# Patient Record
Sex: Male | Born: 1940 | Race: White | Hispanic: No | Smoking: Never smoker
Health system: Southern US, Community
[De-identification: ages and names within clinical notes are randomized; demographics above are authoritative.]

## PROBLEM LIST (undated history)

## (undated) DIAGNOSIS — I10 Essential (primary) hypertension: Secondary | ICD-10-CM

## (undated) DIAGNOSIS — I2510 Atherosclerotic heart disease of native coronary artery without angina pectoris: Secondary | ICD-10-CM

## (undated) DIAGNOSIS — I739 Peripheral vascular disease, unspecified: Secondary | ICD-10-CM

## (undated) DIAGNOSIS — G459 Transient cerebral ischemic attack, unspecified: Secondary | ICD-10-CM

## (undated) DIAGNOSIS — I219 Acute myocardial infarction, unspecified: Secondary | ICD-10-CM

## (undated) DIAGNOSIS — E119 Type 2 diabetes mellitus without complications: Secondary | ICD-10-CM

## (undated) DIAGNOSIS — I639 Cerebral infarction, unspecified: Secondary | ICD-10-CM

## (undated) HISTORY — PX: CAROTID ENDARTERECTOMY: SUR193

## (undated) HISTORY — PX: CORONARY ARTERY BYPASS GRAFT: SHX141

## (undated) HISTORY — PX: OTHER SURGICAL HISTORY: SHX169

---

## 2003-06-06 ENCOUNTER — Other Ambulatory Visit: Payer: Self-pay

## 2003-06-17 ENCOUNTER — Other Ambulatory Visit: Payer: Self-pay

## 2003-11-14 ENCOUNTER — Encounter: Payer: Self-pay | Admitting: Family Medicine

## 2003-12-10 ENCOUNTER — Encounter: Payer: Self-pay | Admitting: Family Medicine

## 2004-01-09 ENCOUNTER — Encounter: Payer: Self-pay | Admitting: Family Medicine

## 2004-02-09 ENCOUNTER — Encounter: Payer: Self-pay | Admitting: Family Medicine

## 2004-03-11 ENCOUNTER — Encounter: Payer: Self-pay | Admitting: Family Medicine

## 2004-04-08 ENCOUNTER — Encounter: Payer: Self-pay | Admitting: Family Medicine

## 2004-05-09 ENCOUNTER — Encounter: Payer: Self-pay | Admitting: Family Medicine

## 2004-06-08 ENCOUNTER — Encounter: Payer: Self-pay | Admitting: Family Medicine

## 2004-07-09 ENCOUNTER — Encounter: Payer: Self-pay | Admitting: Family Medicine

## 2004-08-08 ENCOUNTER — Encounter: Payer: Self-pay | Admitting: Family Medicine

## 2006-08-05 ENCOUNTER — Inpatient Hospital Stay: Payer: Self-pay | Admitting: Internal Medicine

## 2006-08-05 ENCOUNTER — Other Ambulatory Visit: Payer: Self-pay

## 2006-08-06 ENCOUNTER — Other Ambulatory Visit: Payer: Self-pay

## 2006-08-07 ENCOUNTER — Other Ambulatory Visit: Payer: Self-pay

## 2006-08-08 ENCOUNTER — Other Ambulatory Visit: Payer: Self-pay

## 2006-12-08 ENCOUNTER — Ambulatory Visit: Payer: Self-pay | Admitting: Family Medicine

## 2007-10-13 ENCOUNTER — Ambulatory Visit: Payer: Self-pay | Admitting: Internal Medicine

## 2008-03-27 ENCOUNTER — Ambulatory Visit: Payer: Self-pay | Admitting: Family Medicine

## 2008-04-02 ENCOUNTER — Ambulatory Visit: Payer: Self-pay | Admitting: Family Medicine

## 2008-04-15 ENCOUNTER — Ambulatory Visit: Payer: Self-pay | Admitting: Vascular Surgery

## 2008-06-05 ENCOUNTER — Ambulatory Visit: Payer: Self-pay | Admitting: Vascular Surgery

## 2008-06-14 ENCOUNTER — Inpatient Hospital Stay: Payer: Self-pay | Admitting: Vascular Surgery

## 2008-08-05 ENCOUNTER — Ambulatory Visit: Payer: Self-pay | Admitting: Vascular Surgery

## 2009-03-21 ENCOUNTER — Ambulatory Visit: Payer: Self-pay | Admitting: Family Medicine

## 2011-06-10 ENCOUNTER — Ambulatory Visit: Payer: Self-pay | Admitting: Family Medicine

## 2012-01-31 ENCOUNTER — Inpatient Hospital Stay: Payer: Self-pay | Admitting: Internal Medicine

## 2012-01-31 LAB — CBC WITH DIFFERENTIAL/PLATELET
Basophil #: 0.1 10*3/uL (ref 0.0–0.1)
Basophil %: 0.7 %
Eosinophil #: 0.1 10*3/uL (ref 0.0–0.7)
Eosinophil %: 0.9 %
HCT: 41 % (ref 40.0–52.0)
HGB: 14 g/dL (ref 13.0–18.0)
Lymphocyte #: 1.7 10*3/uL (ref 1.0–3.6)
Lymphocyte %: 19.4 %
MCH: 31.5 pg (ref 26.0–34.0)
Neutrophil #: 6.1 10*3/uL (ref 1.4–6.5)
Neutrophil %: 70 %
Platelet: 230 10*3/uL (ref 150–440)
RBC: 4.46 10*6/uL (ref 4.40–5.90)
RDW: 14.1 % (ref 11.5–14.5)

## 2012-01-31 LAB — COMPREHENSIVE METABOLIC PANEL
Albumin: 3.7 g/dL (ref 3.4–5.0)
BUN: 25 mg/dL — ABNORMAL HIGH (ref 7–18)
Bilirubin,Total: 0.6 mg/dL (ref 0.2–1.0)
Chloride: 109 mmol/L — ABNORMAL HIGH (ref 98–107)
Creatinine: 1.13 mg/dL (ref 0.60–1.30)
EGFR (African American): >60
SGOT(AST): 38 U/L — ABNORMAL HIGH (ref 15–37)
SGPT (ALT): 31 U/L (ref 12–78)
Sodium: 140 mmol/L (ref 136–145)
Total Protein: 7.2 g/dL (ref 6.4–8.2)

## 2012-01-31 LAB — CBC
HCT: 41.8 % (ref 40.0–52.0)
MCHC: 33.5 g/dL (ref 32.0–36.0)
MCV: 93 fL (ref 80–100)
Platelet: 231 10*3/uL (ref 150–440)
RBC: 4.52 10*6/uL (ref 4.40–5.90)
RDW: 14 % (ref 11.5–14.5)
WBC: 9.8 10*3/uL (ref 3.8–10.6)

## 2012-01-31 LAB — TROPONIN I
Troponin-I: 3.97 ng/mL — ABNORMAL HIGH
Troponin-I: 4.28 ng/mL — ABNORMAL HIGH

## 2012-01-31 LAB — APTT: Activated PTT: 26.4 secs (ref 23.6–35.9)

## 2012-02-01 LAB — CK TOTAL AND CKMB (NOT AT ARMC)
CK, Total: 157 U/L (ref 35–232)
CK-MB: 9.2 ng/mL — ABNORMAL HIGH (ref 0.5–3.6)

## 2012-02-02 LAB — BASIC METABOLIC PANEL
Anion Gap: 7 (ref 7–16)
BUN: 21 mg/dL — ABNORMAL HIGH (ref 7–18)
Chloride: 107 mmol/L (ref 98–107)
Creatinine: 1.23 mg/dL (ref 0.60–1.30)
EGFR (African American): >60
Glucose: 55 mg/dL — ABNORMAL LOW (ref 65–99)
Osmolality: 282 (ref 275–301)

## 2012-02-02 LAB — CBC WITH DIFFERENTIAL/PLATELET
Basophil #: 0 10*3/uL (ref 0.0–0.1)
Eosinophil #: 0.2 10*3/uL (ref 0.0–0.7)
Eosinophil %: 1.8 %
Lymphocyte #: 1.9 10*3/uL (ref 1.0–3.6)
MCV: 93 fL (ref 80–100)
Monocyte %: 9.9 %
Neutrophil %: 66.2 %
Platelet: 216 10*3/uL (ref 150–440)
RDW: 13.9 % (ref 11.5–14.5)
WBC: 8.9 10*3/uL (ref 3.8–10.6)

## 2012-08-02 ENCOUNTER — Ambulatory Visit: Payer: Self-pay | Admitting: Family Medicine

## 2012-08-16 ENCOUNTER — Ambulatory Visit: Payer: Self-pay | Admitting: Vascular Surgery

## 2012-08-18 ENCOUNTER — Ambulatory Visit: Payer: Self-pay | Admitting: Family Medicine

## 2012-08-18 LAB — CBC WITH DIFFERENTIAL/PLATELET
Basophil #: 0 10*3/uL (ref 0.0–0.1)
Eosinophil %: 0.3 %
HCT: 45.9 % (ref 40.0–52.0)
HGB: 15.9 g/dL (ref 13.0–18.0)
MCH: 31.5 pg (ref 26.0–34.0)
MCHC: 34.6 g/dL (ref 32.0–36.0)
Monocyte #: 0.4 x10 3/mm (ref 0.2–1.0)
Monocyte %: 4.3 %
Neutrophil #: 7.4 10*3/uL — ABNORMAL HIGH (ref 1.4–6.5)
Neutrophil %: 79.1 %
Platelet: 211 10*3/uL (ref 150–440)
RDW: 13.9 % (ref 11.5–14.5)
WBC: 9.4 10*3/uL (ref 3.8–10.6)

## 2012-09-11 ENCOUNTER — Ambulatory Visit: Payer: Self-pay | Admitting: Neurology

## 2012-12-26 ENCOUNTER — Ambulatory Visit: Payer: Self-pay | Admitting: Vascular Surgery

## 2012-12-26 DIAGNOSIS — I2510 Atherosclerotic heart disease of native coronary artery without angina pectoris: Secondary | ICD-10-CM

## 2012-12-26 LAB — BASIC METABOLIC PANEL
Anion Gap: 3 — ABNORMAL LOW (ref 7–16)
BUN: 22 mg/dL — ABNORMAL HIGH (ref 7–18)
Calcium, Total: 9.3 mg/dL (ref 8.5–10.1)
Co2: 28 mmol/L (ref 21–32)
Creatinine: 1.19 mg/dL (ref 0.60–1.30)
EGFR (African American): >60
EGFR (Non-African Amer.): 60 — ABNORMAL LOW
Glucose: 176 mg/dL — ABNORMAL HIGH (ref 65–99)

## 2012-12-26 LAB — CBC
HGB: 14.1 g/dL (ref 13.0–18.0)
MCH: 31.8 pg (ref 26.0–34.0)
RBC: 4.43 10*6/uL (ref 4.40–5.90)

## 2013-01-03 ENCOUNTER — Inpatient Hospital Stay: Payer: Self-pay | Admitting: Vascular Surgery

## 2013-01-03 LAB — BASIC METABOLIC PANEL
Anion Gap: 10 (ref 7–16)
BUN: 23 mg/dL — ABNORMAL HIGH (ref 7–18)
Co2: 19 mmol/L — ABNORMAL LOW (ref 21–32)
EGFR (African American): >60
Osmolality: 288 (ref 275–301)
Potassium: 4.1 mmol/L (ref 3.5–5.1)
Sodium: 142 mmol/L (ref 136–145)

## 2013-01-03 LAB — CBC WITH DIFFERENTIAL/PLATELET
Basophil #: 0.1 10*3/uL (ref 0.0–0.1)
Basophil %: 0.5 %
Eosinophil %: 0.3 %
Lymphocyte #: 1 10*3/uL (ref 1.0–3.6)
Lymphocyte %: 10 %
MCH: 31.8 pg (ref 26.0–34.0)
MCHC: 34.1 g/dL (ref 32.0–36.0)
MCV: 93 fL (ref 80–100)
Monocyte %: 6 %
Neutrophil %: 83.2 %
Platelet: 193 10*3/uL (ref 150–440)
RBC: 3.79 10*6/uL — ABNORMAL LOW (ref 4.40–5.90)
WBC: 9.9 10*3/uL (ref 3.8–10.6)

## 2013-01-03 LAB — TROPONIN I: Troponin-I: 0.02 ng/mL

## 2013-01-03 LAB — MAGNESIUM: Magnesium: 1.9 mg/dL

## 2013-01-04 LAB — CBC WITH DIFFERENTIAL/PLATELET
Eosinophil #: 0 10*3/uL (ref 0.0–0.7)
HCT: 34 % — ABNORMAL LOW (ref 40.0–52.0)
HGB: 11.6 g/dL — ABNORMAL LOW (ref 13.0–18.0)
Lymphocyte #: 1.2 10*3/uL (ref 1.0–3.6)
Lymphocyte %: 12.9 %
MCH: 32 pg (ref 26.0–34.0)
MCHC: 34.2 g/dL (ref 32.0–36.0)
Monocyte #: 1 x10 3/mm (ref 0.2–1.0)
Monocyte %: 10.9 %
Neutrophil #: 7 10*3/uL — ABNORMAL HIGH (ref 1.4–6.5)
Neutrophil %: 75.2 %
RBC: 3.63 10*6/uL — ABNORMAL LOW (ref 4.40–5.90)
RDW: 13.9 % (ref 11.5–14.5)

## 2013-01-04 LAB — BASIC METABOLIC PANEL
BUN: 22 mg/dL — ABNORMAL HIGH (ref 7–18)
Chloride: 113 mmol/L — ABNORMAL HIGH (ref 98–107)
Co2: 23 mmol/L (ref 21–32)
EGFR (African American): >60
EGFR (Non-African Amer.): 56 — ABNORMAL LOW
Potassium: 3.8 mmol/L (ref 3.5–5.1)
Sodium: 142 mmol/L (ref 136–145)

## 2013-01-04 LAB — APTT: Activated PTT: 28.7 secs (ref 23.6–35.9)

## 2013-01-05 LAB — BASIC METABOLIC PANEL
BUN: 20 mg/dL — ABNORMAL HIGH (ref 7–18)
Chloride: 109 mmol/L — ABNORMAL HIGH (ref 98–107)
Creatinine: 1.33 mg/dL — ABNORMAL HIGH (ref 0.60–1.30)
Osmolality: 286 (ref 275–301)

## 2013-01-05 LAB — PATHOLOGY REPORT

## 2013-03-19 ENCOUNTER — Ambulatory Visit: Payer: Self-pay | Admitting: Family Medicine

## 2013-05-07 ENCOUNTER — Ambulatory Visit: Payer: Self-pay | Admitting: Ophthalmology

## 2013-06-12 ENCOUNTER — Ambulatory Visit: Payer: Self-pay | Admitting: Urology

## 2013-07-20 ENCOUNTER — Ambulatory Visit: Payer: Self-pay | Admitting: Family Medicine

## 2014-03-04 DIAGNOSIS — H34812 Central retinal vein occlusion, left eye: Secondary | ICD-10-CM | POA: Diagnosis not present

## 2014-03-12 DIAGNOSIS — R221 Localized swelling, mass and lump, neck: Secondary | ICD-10-CM | POA: Diagnosis not present

## 2014-03-13 DIAGNOSIS — H4011X4 Primary open-angle glaucoma, indeterminate stage: Secondary | ICD-10-CM | POA: Diagnosis not present

## 2014-03-14 ENCOUNTER — Ambulatory Visit: Payer: Self-pay | Admitting: Otolaryngology

## 2014-03-14 DIAGNOSIS — R590 Localized enlarged lymph nodes: Secondary | ICD-10-CM | POA: Diagnosis not present

## 2014-03-14 DIAGNOSIS — M47892 Other spondylosis, cervical region: Secondary | ICD-10-CM | POA: Diagnosis not present

## 2014-03-14 DIAGNOSIS — R221 Localized swelling, mass and lump, neck: Secondary | ICD-10-CM | POA: Diagnosis not present

## 2014-03-14 DIAGNOSIS — I2510 Atherosclerotic heart disease of native coronary artery without angina pectoris: Secondary | ICD-10-CM | POA: Diagnosis not present

## 2014-03-14 DIAGNOSIS — I881 Chronic lymphadenitis, except mesenteric: Secondary | ICD-10-CM | POA: Diagnosis not present

## 2014-03-14 LAB — CREATININE, SERUM
CREATININE: 1.29 mg/dL (ref 0.60–1.30)
GFR CALC NON AF AMER: 58 — AB

## 2014-03-19 DIAGNOSIS — R221 Localized swelling, mass and lump, neck: Secondary | ICD-10-CM | POA: Diagnosis not present

## 2014-03-19 DIAGNOSIS — C01 Malignant neoplasm of base of tongue: Secondary | ICD-10-CM | POA: Diagnosis not present

## 2014-03-20 ENCOUNTER — Ambulatory Visit: Payer: Self-pay | Admitting: Oncology

## 2014-03-22 ENCOUNTER — Ambulatory Visit: Payer: Self-pay | Admitting: Oncology

## 2014-03-22 DIAGNOSIS — Z8673 Personal history of transient ischemic attack (TIA), and cerebral infarction without residual deficits: Secondary | ICD-10-CM | POA: Diagnosis not present

## 2014-03-22 DIAGNOSIS — Z794 Long term (current) use of insulin: Secondary | ICD-10-CM | POA: Diagnosis not present

## 2014-03-22 DIAGNOSIS — E119 Type 2 diabetes mellitus without complications: Secondary | ICD-10-CM | POA: Diagnosis not present

## 2014-03-22 DIAGNOSIS — I2510 Atherosclerotic heart disease of native coronary artery without angina pectoris: Secondary | ICD-10-CM | POA: Diagnosis not present

## 2014-03-22 DIAGNOSIS — I10 Essential (primary) hypertension: Secondary | ICD-10-CM | POA: Diagnosis not present

## 2014-03-22 DIAGNOSIS — Z951 Presence of aortocoronary bypass graft: Secondary | ICD-10-CM | POA: Diagnosis not present

## 2014-03-22 DIAGNOSIS — N289 Disorder of kidney and ureter, unspecified: Secondary | ICD-10-CM | POA: Diagnosis not present

## 2014-03-22 DIAGNOSIS — C01 Malignant neoplasm of base of tongue: Secondary | ICD-10-CM | POA: Diagnosis not present

## 2014-03-22 DIAGNOSIS — Z79899 Other long term (current) drug therapy: Secondary | ICD-10-CM | POA: Diagnosis not present

## 2014-03-22 DIAGNOSIS — Z510 Encounter for antineoplastic radiation therapy: Secondary | ICD-10-CM | POA: Diagnosis not present

## 2014-03-22 DIAGNOSIS — I252 Old myocardial infarction: Secondary | ICD-10-CM | POA: Diagnosis not present

## 2014-03-22 DIAGNOSIS — Z5111 Encounter for antineoplastic chemotherapy: Secondary | ICD-10-CM | POA: Diagnosis not present

## 2014-03-22 DIAGNOSIS — C770 Secondary and unspecified malignant neoplasm of lymph nodes of head, face and neck: Secondary | ICD-10-CM | POA: Diagnosis not present

## 2014-03-22 DIAGNOSIS — Z7982 Long term (current) use of aspirin: Secondary | ICD-10-CM | POA: Diagnosis not present

## 2014-03-26 ENCOUNTER — Ambulatory Visit: Payer: Self-pay | Admitting: Oncology

## 2014-03-26 DIAGNOSIS — C490 Malignant neoplasm of connective and soft tissue of head, face and neck: Secondary | ICD-10-CM | POA: Diagnosis not present

## 2014-03-26 DIAGNOSIS — C029 Malignant neoplasm of tongue, unspecified: Secondary | ICD-10-CM | POA: Diagnosis not present

## 2014-03-26 DIAGNOSIS — C01 Malignant neoplasm of base of tongue: Secondary | ICD-10-CM | POA: Diagnosis not present

## 2014-04-01 DIAGNOSIS — I2510 Atherosclerotic heart disease of native coronary artery without angina pectoris: Secondary | ICD-10-CM | POA: Diagnosis not present

## 2014-04-01 DIAGNOSIS — Z8673 Personal history of transient ischemic attack (TIA), and cerebral infarction without residual deficits: Secondary | ICD-10-CM | POA: Diagnosis not present

## 2014-04-01 DIAGNOSIS — I10 Essential (primary) hypertension: Secondary | ICD-10-CM | POA: Diagnosis not present

## 2014-04-01 DIAGNOSIS — Z5111 Encounter for antineoplastic chemotherapy: Secondary | ICD-10-CM | POA: Diagnosis not present

## 2014-04-01 DIAGNOSIS — N289 Disorder of kidney and ureter, unspecified: Secondary | ICD-10-CM | POA: Diagnosis not present

## 2014-04-01 DIAGNOSIS — E119 Type 2 diabetes mellitus without complications: Secondary | ICD-10-CM | POA: Diagnosis not present

## 2014-04-01 DIAGNOSIS — I252 Old myocardial infarction: Secondary | ICD-10-CM | POA: Diagnosis not present

## 2014-04-01 DIAGNOSIS — Z510 Encounter for antineoplastic radiation therapy: Secondary | ICD-10-CM | POA: Diagnosis not present

## 2014-04-01 DIAGNOSIS — C01 Malignant neoplasm of base of tongue: Secondary | ICD-10-CM | POA: Diagnosis not present

## 2014-04-01 DIAGNOSIS — C770 Secondary and unspecified malignant neoplasm of lymph nodes of head, face and neck: Secondary | ICD-10-CM | POA: Diagnosis not present

## 2014-04-01 DIAGNOSIS — Z7982 Long term (current) use of aspirin: Secondary | ICD-10-CM | POA: Diagnosis not present

## 2014-04-01 DIAGNOSIS — Z951 Presence of aortocoronary bypass graft: Secondary | ICD-10-CM | POA: Diagnosis not present

## 2014-04-01 DIAGNOSIS — Z79899 Other long term (current) drug therapy: Secondary | ICD-10-CM | POA: Diagnosis not present

## 2014-04-01 DIAGNOSIS — Z794 Long term (current) use of insulin: Secondary | ICD-10-CM | POA: Diagnosis not present

## 2014-04-03 DIAGNOSIS — I10 Essential (primary) hypertension: Secondary | ICD-10-CM | POA: Diagnosis not present

## 2014-04-03 DIAGNOSIS — N289 Disorder of kidney and ureter, unspecified: Secondary | ICD-10-CM | POA: Diagnosis not present

## 2014-04-03 DIAGNOSIS — Z7982 Long term (current) use of aspirin: Secondary | ICD-10-CM | POA: Diagnosis not present

## 2014-04-03 DIAGNOSIS — Z794 Long term (current) use of insulin: Secondary | ICD-10-CM | POA: Diagnosis not present

## 2014-04-03 DIAGNOSIS — I2510 Atherosclerotic heart disease of native coronary artery without angina pectoris: Secondary | ICD-10-CM | POA: Diagnosis not present

## 2014-04-03 DIAGNOSIS — C01 Malignant neoplasm of base of tongue: Secondary | ICD-10-CM | POA: Diagnosis not present

## 2014-04-03 DIAGNOSIS — Z510 Encounter for antineoplastic radiation therapy: Secondary | ICD-10-CM | POA: Diagnosis not present

## 2014-04-03 DIAGNOSIS — Z5111 Encounter for antineoplastic chemotherapy: Secondary | ICD-10-CM | POA: Diagnosis not present

## 2014-04-03 DIAGNOSIS — I252 Old myocardial infarction: Secondary | ICD-10-CM | POA: Diagnosis not present

## 2014-04-03 DIAGNOSIS — C770 Secondary and unspecified malignant neoplasm of lymph nodes of head, face and neck: Secondary | ICD-10-CM | POA: Diagnosis not present

## 2014-04-03 DIAGNOSIS — Z951 Presence of aortocoronary bypass graft: Secondary | ICD-10-CM | POA: Diagnosis not present

## 2014-04-03 DIAGNOSIS — E119 Type 2 diabetes mellitus without complications: Secondary | ICD-10-CM | POA: Diagnosis not present

## 2014-04-03 DIAGNOSIS — Z79899 Other long term (current) drug therapy: Secondary | ICD-10-CM | POA: Diagnosis not present

## 2014-04-03 DIAGNOSIS — Z8673 Personal history of transient ischemic attack (TIA), and cerebral infarction without residual deficits: Secondary | ICD-10-CM | POA: Diagnosis not present

## 2014-04-08 DIAGNOSIS — Z510 Encounter for antineoplastic radiation therapy: Secondary | ICD-10-CM | POA: Diagnosis not present

## 2014-04-08 DIAGNOSIS — Z5111 Encounter for antineoplastic chemotherapy: Secondary | ICD-10-CM | POA: Diagnosis not present

## 2014-04-08 DIAGNOSIS — N289 Disorder of kidney and ureter, unspecified: Secondary | ICD-10-CM | POA: Diagnosis not present

## 2014-04-08 DIAGNOSIS — I252 Old myocardial infarction: Secondary | ICD-10-CM | POA: Diagnosis not present

## 2014-04-08 DIAGNOSIS — C01 Malignant neoplasm of base of tongue: Secondary | ICD-10-CM | POA: Diagnosis not present

## 2014-04-08 DIAGNOSIS — Z7982 Long term (current) use of aspirin: Secondary | ICD-10-CM | POA: Diagnosis not present

## 2014-04-08 DIAGNOSIS — Z79899 Other long term (current) drug therapy: Secondary | ICD-10-CM | POA: Diagnosis not present

## 2014-04-08 DIAGNOSIS — E119 Type 2 diabetes mellitus without complications: Secondary | ICD-10-CM | POA: Diagnosis not present

## 2014-04-08 DIAGNOSIS — Z951 Presence of aortocoronary bypass graft: Secondary | ICD-10-CM | POA: Diagnosis not present

## 2014-04-08 DIAGNOSIS — Z8673 Personal history of transient ischemic attack (TIA), and cerebral infarction without residual deficits: Secondary | ICD-10-CM | POA: Diagnosis not present

## 2014-04-08 DIAGNOSIS — I2510 Atherosclerotic heart disease of native coronary artery without angina pectoris: Secondary | ICD-10-CM | POA: Diagnosis not present

## 2014-04-08 DIAGNOSIS — C770 Secondary and unspecified malignant neoplasm of lymph nodes of head, face and neck: Secondary | ICD-10-CM | POA: Diagnosis not present

## 2014-04-08 DIAGNOSIS — I10 Essential (primary) hypertension: Secondary | ICD-10-CM | POA: Diagnosis not present

## 2014-04-08 DIAGNOSIS — Z794 Long term (current) use of insulin: Secondary | ICD-10-CM | POA: Diagnosis not present

## 2014-04-09 ENCOUNTER — Ambulatory Visit
Admit: 2014-04-09 | Disposition: A | Discharge status: Home / Self Care | Payer: Self-pay | Attending: Oncology | Admitting: Oncology

## 2014-04-15 DIAGNOSIS — C770 Secondary and unspecified malignant neoplasm of lymph nodes of head, face and neck: Secondary | ICD-10-CM | POA: Diagnosis not present

## 2014-04-15 DIAGNOSIS — N289 Disorder of kidney and ureter, unspecified: Secondary | ICD-10-CM | POA: Diagnosis not present

## 2014-04-15 DIAGNOSIS — E1165 Type 2 diabetes mellitus with hyperglycemia: Secondary | ICD-10-CM | POA: Diagnosis not present

## 2014-04-15 DIAGNOSIS — I10 Essential (primary) hypertension: Secondary | ICD-10-CM | POA: Diagnosis not present

## 2014-04-15 DIAGNOSIS — R5383 Other fatigue: Secondary | ICD-10-CM | POA: Diagnosis not present

## 2014-04-15 DIAGNOSIS — R630 Anorexia: Secondary | ICD-10-CM | POA: Diagnosis not present

## 2014-04-15 DIAGNOSIS — K5900 Constipation, unspecified: Secondary | ICD-10-CM | POA: Diagnosis not present

## 2014-04-15 DIAGNOSIS — Z510 Encounter for antineoplastic radiation therapy: Secondary | ICD-10-CM | POA: Diagnosis not present

## 2014-04-15 DIAGNOSIS — Z8673 Personal history of transient ischemic attack (TIA), and cerebral infarction without residual deficits: Secondary | ICD-10-CM | POA: Diagnosis not present

## 2014-04-15 DIAGNOSIS — Z7982 Long term (current) use of aspirin: Secondary | ICD-10-CM | POA: Diagnosis not present

## 2014-04-15 DIAGNOSIS — Z79899 Other long term (current) drug therapy: Secondary | ICD-10-CM | POA: Diagnosis not present

## 2014-04-15 DIAGNOSIS — Z951 Presence of aortocoronary bypass graft: Secondary | ICD-10-CM | POA: Diagnosis not present

## 2014-04-15 DIAGNOSIS — Z5111 Encounter for antineoplastic chemotherapy: Secondary | ICD-10-CM | POA: Diagnosis not present

## 2014-04-15 DIAGNOSIS — I252 Old myocardial infarction: Secondary | ICD-10-CM | POA: Diagnosis not present

## 2014-04-15 DIAGNOSIS — C01 Malignant neoplasm of base of tongue: Secondary | ICD-10-CM | POA: Diagnosis not present

## 2014-04-15 DIAGNOSIS — E119 Type 2 diabetes mellitus without complications: Secondary | ICD-10-CM | POA: Diagnosis not present

## 2014-04-15 DIAGNOSIS — Z794 Long term (current) use of insulin: Secondary | ICD-10-CM | POA: Diagnosis not present

## 2014-04-15 DIAGNOSIS — R1310 Dysphagia, unspecified: Secondary | ICD-10-CM | POA: Diagnosis not present

## 2014-04-15 DIAGNOSIS — I2510 Atherosclerotic heart disease of native coronary artery without angina pectoris: Secondary | ICD-10-CM | POA: Diagnosis not present

## 2014-04-16 DIAGNOSIS — Z951 Presence of aortocoronary bypass graft: Secondary | ICD-10-CM | POA: Diagnosis not present

## 2014-04-16 DIAGNOSIS — C770 Secondary and unspecified malignant neoplasm of lymph nodes of head, face and neck: Secondary | ICD-10-CM | POA: Diagnosis not present

## 2014-04-16 DIAGNOSIS — Z5111 Encounter for antineoplastic chemotherapy: Secondary | ICD-10-CM | POA: Diagnosis not present

## 2014-04-16 DIAGNOSIS — K5900 Constipation, unspecified: Secondary | ICD-10-CM | POA: Diagnosis not present

## 2014-04-16 DIAGNOSIS — Z7982 Long term (current) use of aspirin: Secondary | ICD-10-CM | POA: Diagnosis not present

## 2014-04-16 DIAGNOSIS — I2510 Atherosclerotic heart disease of native coronary artery without angina pectoris: Secondary | ICD-10-CM | POA: Diagnosis not present

## 2014-04-16 DIAGNOSIS — Z510 Encounter for antineoplastic radiation therapy: Secondary | ICD-10-CM | POA: Diagnosis not present

## 2014-04-16 DIAGNOSIS — N289 Disorder of kidney and ureter, unspecified: Secondary | ICD-10-CM | POA: Diagnosis not present

## 2014-04-16 DIAGNOSIS — I252 Old myocardial infarction: Secondary | ICD-10-CM | POA: Diagnosis not present

## 2014-04-16 DIAGNOSIS — E119 Type 2 diabetes mellitus without complications: Secondary | ICD-10-CM | POA: Diagnosis not present

## 2014-04-16 DIAGNOSIS — C01 Malignant neoplasm of base of tongue: Secondary | ICD-10-CM | POA: Diagnosis not present

## 2014-04-16 DIAGNOSIS — E1165 Type 2 diabetes mellitus with hyperglycemia: Secondary | ICD-10-CM | POA: Diagnosis not present

## 2014-04-16 DIAGNOSIS — I10 Essential (primary) hypertension: Secondary | ICD-10-CM | POA: Diagnosis not present

## 2014-04-16 DIAGNOSIS — Z79899 Other long term (current) drug therapy: Secondary | ICD-10-CM | POA: Diagnosis not present

## 2014-04-16 DIAGNOSIS — R630 Anorexia: Secondary | ICD-10-CM | POA: Diagnosis not present

## 2014-04-16 DIAGNOSIS — Z794 Long term (current) use of insulin: Secondary | ICD-10-CM | POA: Diagnosis not present

## 2014-04-16 DIAGNOSIS — R1310 Dysphagia, unspecified: Secondary | ICD-10-CM | POA: Diagnosis not present

## 2014-04-16 DIAGNOSIS — R5383 Other fatigue: Secondary | ICD-10-CM | POA: Diagnosis not present

## 2014-04-16 DIAGNOSIS — Z8673 Personal history of transient ischemic attack (TIA), and cerebral infarction without residual deficits: Secondary | ICD-10-CM | POA: Diagnosis not present

## 2014-04-17 DIAGNOSIS — Z79899 Other long term (current) drug therapy: Secondary | ICD-10-CM | POA: Diagnosis not present

## 2014-04-17 DIAGNOSIS — C01 Malignant neoplasm of base of tongue: Secondary | ICD-10-CM | POA: Diagnosis not present

## 2014-04-17 DIAGNOSIS — C770 Secondary and unspecified malignant neoplasm of lymph nodes of head, face and neck: Secondary | ICD-10-CM | POA: Diagnosis not present

## 2014-04-17 DIAGNOSIS — R1310 Dysphagia, unspecified: Secondary | ICD-10-CM | POA: Diagnosis not present

## 2014-04-17 DIAGNOSIS — Z7982 Long term (current) use of aspirin: Secondary | ICD-10-CM | POA: Diagnosis not present

## 2014-04-17 DIAGNOSIS — E1165 Type 2 diabetes mellitus with hyperglycemia: Secondary | ICD-10-CM | POA: Diagnosis not present

## 2014-04-17 DIAGNOSIS — R5383 Other fatigue: Secondary | ICD-10-CM | POA: Diagnosis not present

## 2014-04-17 DIAGNOSIS — Z5111 Encounter for antineoplastic chemotherapy: Secondary | ICD-10-CM | POA: Diagnosis not present

## 2014-04-17 DIAGNOSIS — N289 Disorder of kidney and ureter, unspecified: Secondary | ICD-10-CM | POA: Diagnosis not present

## 2014-04-17 DIAGNOSIS — E119 Type 2 diabetes mellitus without complications: Secondary | ICD-10-CM | POA: Diagnosis not present

## 2014-04-17 DIAGNOSIS — I10 Essential (primary) hypertension: Secondary | ICD-10-CM | POA: Diagnosis not present

## 2014-04-17 DIAGNOSIS — Z951 Presence of aortocoronary bypass graft: Secondary | ICD-10-CM | POA: Diagnosis not present

## 2014-04-17 DIAGNOSIS — Z794 Long term (current) use of insulin: Secondary | ICD-10-CM | POA: Diagnosis not present

## 2014-04-17 DIAGNOSIS — K5900 Constipation, unspecified: Secondary | ICD-10-CM | POA: Diagnosis not present

## 2014-04-17 DIAGNOSIS — R630 Anorexia: Secondary | ICD-10-CM | POA: Diagnosis not present

## 2014-04-17 DIAGNOSIS — Z510 Encounter for antineoplastic radiation therapy: Secondary | ICD-10-CM | POA: Diagnosis not present

## 2014-04-17 DIAGNOSIS — Z8673 Personal history of transient ischemic attack (TIA), and cerebral infarction without residual deficits: Secondary | ICD-10-CM | POA: Diagnosis not present

## 2014-04-17 DIAGNOSIS — I2510 Atherosclerotic heart disease of native coronary artery without angina pectoris: Secondary | ICD-10-CM | POA: Diagnosis not present

## 2014-04-17 DIAGNOSIS — I252 Old myocardial infarction: Secondary | ICD-10-CM | POA: Diagnosis not present

## 2014-04-18 DIAGNOSIS — R1310 Dysphagia, unspecified: Secondary | ICD-10-CM | POA: Diagnosis not present

## 2014-04-18 DIAGNOSIS — I10 Essential (primary) hypertension: Secondary | ICD-10-CM | POA: Diagnosis not present

## 2014-04-18 DIAGNOSIS — Z8673 Personal history of transient ischemic attack (TIA), and cerebral infarction without residual deficits: Secondary | ICD-10-CM | POA: Diagnosis not present

## 2014-04-18 DIAGNOSIS — C770 Secondary and unspecified malignant neoplasm of lymph nodes of head, face and neck: Secondary | ICD-10-CM | POA: Diagnosis not present

## 2014-04-18 DIAGNOSIS — Z951 Presence of aortocoronary bypass graft: Secondary | ICD-10-CM | POA: Diagnosis not present

## 2014-04-18 DIAGNOSIS — Z79899 Other long term (current) drug therapy: Secondary | ICD-10-CM | POA: Diagnosis not present

## 2014-04-18 DIAGNOSIS — Z7982 Long term (current) use of aspirin: Secondary | ICD-10-CM | POA: Diagnosis not present

## 2014-04-18 DIAGNOSIS — K5900 Constipation, unspecified: Secondary | ICD-10-CM | POA: Diagnosis not present

## 2014-04-18 DIAGNOSIS — E119 Type 2 diabetes mellitus without complications: Secondary | ICD-10-CM | POA: Diagnosis not present

## 2014-04-18 DIAGNOSIS — I2510 Atherosclerotic heart disease of native coronary artery without angina pectoris: Secondary | ICD-10-CM | POA: Diagnosis not present

## 2014-04-18 DIAGNOSIS — E1165 Type 2 diabetes mellitus with hyperglycemia: Secondary | ICD-10-CM | POA: Diagnosis not present

## 2014-04-18 DIAGNOSIS — N289 Disorder of kidney and ureter, unspecified: Secondary | ICD-10-CM | POA: Diagnosis not present

## 2014-04-18 DIAGNOSIS — R630 Anorexia: Secondary | ICD-10-CM | POA: Diagnosis not present

## 2014-04-18 DIAGNOSIS — Z794 Long term (current) use of insulin: Secondary | ICD-10-CM | POA: Diagnosis not present

## 2014-04-18 DIAGNOSIS — Z5111 Encounter for antineoplastic chemotherapy: Secondary | ICD-10-CM | POA: Diagnosis not present

## 2014-04-18 DIAGNOSIS — R5383 Other fatigue: Secondary | ICD-10-CM | POA: Diagnosis not present

## 2014-04-18 DIAGNOSIS — C01 Malignant neoplasm of base of tongue: Secondary | ICD-10-CM | POA: Diagnosis not present

## 2014-04-18 DIAGNOSIS — I252 Old myocardial infarction: Secondary | ICD-10-CM | POA: Diagnosis not present

## 2014-04-18 DIAGNOSIS — Z510 Encounter for antineoplastic radiation therapy: Secondary | ICD-10-CM | POA: Diagnosis not present

## 2014-04-19 DIAGNOSIS — R630 Anorexia: Secondary | ICD-10-CM | POA: Diagnosis not present

## 2014-04-19 DIAGNOSIS — R5383 Other fatigue: Secondary | ICD-10-CM | POA: Diagnosis not present

## 2014-04-19 DIAGNOSIS — I2510 Atherosclerotic heart disease of native coronary artery without angina pectoris: Secondary | ICD-10-CM | POA: Diagnosis not present

## 2014-04-19 DIAGNOSIS — C01 Malignant neoplasm of base of tongue: Secondary | ICD-10-CM | POA: Diagnosis not present

## 2014-04-19 DIAGNOSIS — Z510 Encounter for antineoplastic radiation therapy: Secondary | ICD-10-CM | POA: Diagnosis not present

## 2014-04-19 DIAGNOSIS — Z5111 Encounter for antineoplastic chemotherapy: Secondary | ICD-10-CM | POA: Diagnosis not present

## 2014-04-19 DIAGNOSIS — Z79899 Other long term (current) drug therapy: Secondary | ICD-10-CM | POA: Diagnosis not present

## 2014-04-19 DIAGNOSIS — N289 Disorder of kidney and ureter, unspecified: Secondary | ICD-10-CM | POA: Diagnosis not present

## 2014-04-19 DIAGNOSIS — E1165 Type 2 diabetes mellitus with hyperglycemia: Secondary | ICD-10-CM | POA: Diagnosis not present

## 2014-04-19 DIAGNOSIS — I252 Old myocardial infarction: Secondary | ICD-10-CM | POA: Diagnosis not present

## 2014-04-19 DIAGNOSIS — Z8673 Personal history of transient ischemic attack (TIA), and cerebral infarction without residual deficits: Secondary | ICD-10-CM | POA: Diagnosis not present

## 2014-04-19 DIAGNOSIS — I10 Essential (primary) hypertension: Secondary | ICD-10-CM | POA: Diagnosis not present

## 2014-04-19 DIAGNOSIS — Z794 Long term (current) use of insulin: Secondary | ICD-10-CM | POA: Diagnosis not present

## 2014-04-19 DIAGNOSIS — R1310 Dysphagia, unspecified: Secondary | ICD-10-CM | POA: Diagnosis not present

## 2014-04-19 DIAGNOSIS — Z951 Presence of aortocoronary bypass graft: Secondary | ICD-10-CM | POA: Diagnosis not present

## 2014-04-19 DIAGNOSIS — K5900 Constipation, unspecified: Secondary | ICD-10-CM | POA: Diagnosis not present

## 2014-04-19 DIAGNOSIS — C770 Secondary and unspecified malignant neoplasm of lymph nodes of head, face and neck: Secondary | ICD-10-CM | POA: Diagnosis not present

## 2014-04-19 DIAGNOSIS — Z7982 Long term (current) use of aspirin: Secondary | ICD-10-CM | POA: Diagnosis not present

## 2014-04-19 DIAGNOSIS — E119 Type 2 diabetes mellitus without complications: Secondary | ICD-10-CM | POA: Diagnosis not present

## 2014-04-22 DIAGNOSIS — Z5111 Encounter for antineoplastic chemotherapy: Secondary | ICD-10-CM | POA: Diagnosis not present

## 2014-04-22 DIAGNOSIS — I10 Essential (primary) hypertension: Secondary | ICD-10-CM | POA: Diagnosis not present

## 2014-04-22 DIAGNOSIS — Z794 Long term (current) use of insulin: Secondary | ICD-10-CM | POA: Diagnosis not present

## 2014-04-22 DIAGNOSIS — K5900 Constipation, unspecified: Secondary | ICD-10-CM | POA: Diagnosis not present

## 2014-04-22 DIAGNOSIS — I2510 Atherosclerotic heart disease of native coronary artery without angina pectoris: Secondary | ICD-10-CM | POA: Diagnosis not present

## 2014-04-22 DIAGNOSIS — Z79899 Other long term (current) drug therapy: Secondary | ICD-10-CM | POA: Diagnosis not present

## 2014-04-22 DIAGNOSIS — E1165 Type 2 diabetes mellitus with hyperglycemia: Secondary | ICD-10-CM | POA: Diagnosis not present

## 2014-04-22 DIAGNOSIS — Z8673 Personal history of transient ischemic attack (TIA), and cerebral infarction without residual deficits: Secondary | ICD-10-CM | POA: Diagnosis not present

## 2014-04-22 DIAGNOSIS — R1310 Dysphagia, unspecified: Secondary | ICD-10-CM | POA: Diagnosis not present

## 2014-04-22 DIAGNOSIS — E119 Type 2 diabetes mellitus without complications: Secondary | ICD-10-CM | POA: Diagnosis not present

## 2014-04-22 DIAGNOSIS — Z951 Presence of aortocoronary bypass graft: Secondary | ICD-10-CM | POA: Diagnosis not present

## 2014-04-22 DIAGNOSIS — R5383 Other fatigue: Secondary | ICD-10-CM | POA: Diagnosis not present

## 2014-04-22 DIAGNOSIS — R630 Anorexia: Secondary | ICD-10-CM | POA: Diagnosis not present

## 2014-04-22 DIAGNOSIS — C770 Secondary and unspecified malignant neoplasm of lymph nodes of head, face and neck: Secondary | ICD-10-CM | POA: Diagnosis not present

## 2014-04-22 DIAGNOSIS — Z510 Encounter for antineoplastic radiation therapy: Secondary | ICD-10-CM | POA: Diagnosis not present

## 2014-04-22 DIAGNOSIS — C01 Malignant neoplasm of base of tongue: Secondary | ICD-10-CM | POA: Diagnosis not present

## 2014-04-22 DIAGNOSIS — N289 Disorder of kidney and ureter, unspecified: Secondary | ICD-10-CM | POA: Diagnosis not present

## 2014-04-22 DIAGNOSIS — Z7982 Long term (current) use of aspirin: Secondary | ICD-10-CM | POA: Diagnosis not present

## 2014-04-22 DIAGNOSIS — I252 Old myocardial infarction: Secondary | ICD-10-CM | POA: Diagnosis not present

## 2014-04-24 DIAGNOSIS — R5383 Other fatigue: Secondary | ICD-10-CM | POA: Diagnosis not present

## 2014-04-24 DIAGNOSIS — I252 Old myocardial infarction: Secondary | ICD-10-CM | POA: Diagnosis not present

## 2014-04-24 DIAGNOSIS — Z951 Presence of aortocoronary bypass graft: Secondary | ICD-10-CM | POA: Diagnosis not present

## 2014-04-24 DIAGNOSIS — C770 Secondary and unspecified malignant neoplasm of lymph nodes of head, face and neck: Secondary | ICD-10-CM | POA: Diagnosis not present

## 2014-04-24 DIAGNOSIS — Z79899 Other long term (current) drug therapy: Secondary | ICD-10-CM | POA: Diagnosis not present

## 2014-04-24 DIAGNOSIS — K5900 Constipation, unspecified: Secondary | ICD-10-CM | POA: Diagnosis not present

## 2014-04-24 DIAGNOSIS — Z5111 Encounter for antineoplastic chemotherapy: Secondary | ICD-10-CM | POA: Diagnosis not present

## 2014-04-24 DIAGNOSIS — R630 Anorexia: Secondary | ICD-10-CM | POA: Diagnosis not present

## 2014-04-24 DIAGNOSIS — Z8673 Personal history of transient ischemic attack (TIA), and cerebral infarction without residual deficits: Secondary | ICD-10-CM | POA: Diagnosis not present

## 2014-04-24 DIAGNOSIS — R1310 Dysphagia, unspecified: Secondary | ICD-10-CM | POA: Diagnosis not present

## 2014-04-24 DIAGNOSIS — I2510 Atherosclerotic heart disease of native coronary artery without angina pectoris: Secondary | ICD-10-CM | POA: Diagnosis not present

## 2014-04-24 DIAGNOSIS — E1165 Type 2 diabetes mellitus with hyperglycemia: Secondary | ICD-10-CM | POA: Diagnosis not present

## 2014-04-24 DIAGNOSIS — N289 Disorder of kidney and ureter, unspecified: Secondary | ICD-10-CM | POA: Diagnosis not present

## 2014-04-24 DIAGNOSIS — C01 Malignant neoplasm of base of tongue: Secondary | ICD-10-CM | POA: Diagnosis not present

## 2014-04-24 DIAGNOSIS — I10 Essential (primary) hypertension: Secondary | ICD-10-CM | POA: Diagnosis not present

## 2014-04-24 DIAGNOSIS — E119 Type 2 diabetes mellitus without complications: Secondary | ICD-10-CM | POA: Diagnosis not present

## 2014-04-24 DIAGNOSIS — Z510 Encounter for antineoplastic radiation therapy: Secondary | ICD-10-CM | POA: Diagnosis not present

## 2014-04-24 DIAGNOSIS — Z794 Long term (current) use of insulin: Secondary | ICD-10-CM | POA: Diagnosis not present

## 2014-04-24 DIAGNOSIS — Z7982 Long term (current) use of aspirin: Secondary | ICD-10-CM | POA: Diagnosis not present

## 2014-04-26 DIAGNOSIS — R1310 Dysphagia, unspecified: Secondary | ICD-10-CM | POA: Diagnosis not present

## 2014-04-26 DIAGNOSIS — Z794 Long term (current) use of insulin: Secondary | ICD-10-CM | POA: Diagnosis not present

## 2014-04-26 DIAGNOSIS — I252 Old myocardial infarction: Secondary | ICD-10-CM | POA: Diagnosis not present

## 2014-04-26 DIAGNOSIS — E119 Type 2 diabetes mellitus without complications: Secondary | ICD-10-CM | POA: Diagnosis not present

## 2014-04-26 DIAGNOSIS — K5900 Constipation, unspecified: Secondary | ICD-10-CM | POA: Diagnosis not present

## 2014-04-26 DIAGNOSIS — R630 Anorexia: Secondary | ICD-10-CM | POA: Diagnosis not present

## 2014-04-26 DIAGNOSIS — Z7982 Long term (current) use of aspirin: Secondary | ICD-10-CM | POA: Diagnosis not present

## 2014-04-26 DIAGNOSIS — I2510 Atherosclerotic heart disease of native coronary artery without angina pectoris: Secondary | ICD-10-CM | POA: Diagnosis not present

## 2014-04-26 DIAGNOSIS — Z951 Presence of aortocoronary bypass graft: Secondary | ICD-10-CM | POA: Diagnosis not present

## 2014-04-26 DIAGNOSIS — N289 Disorder of kidney and ureter, unspecified: Secondary | ICD-10-CM | POA: Diagnosis not present

## 2014-04-26 DIAGNOSIS — Z8673 Personal history of transient ischemic attack (TIA), and cerebral infarction without residual deficits: Secondary | ICD-10-CM | POA: Diagnosis not present

## 2014-04-26 DIAGNOSIS — Z79899 Other long term (current) drug therapy: Secondary | ICD-10-CM | POA: Diagnosis not present

## 2014-04-26 DIAGNOSIS — C01 Malignant neoplasm of base of tongue: Secondary | ICD-10-CM | POA: Diagnosis not present

## 2014-04-26 DIAGNOSIS — Z510 Encounter for antineoplastic radiation therapy: Secondary | ICD-10-CM | POA: Diagnosis not present

## 2014-04-26 DIAGNOSIS — Z5111 Encounter for antineoplastic chemotherapy: Secondary | ICD-10-CM | POA: Diagnosis not present

## 2014-04-26 DIAGNOSIS — E1165 Type 2 diabetes mellitus with hyperglycemia: Secondary | ICD-10-CM | POA: Diagnosis not present

## 2014-04-26 DIAGNOSIS — I10 Essential (primary) hypertension: Secondary | ICD-10-CM | POA: Diagnosis not present

## 2014-04-26 DIAGNOSIS — C770 Secondary and unspecified malignant neoplasm of lymph nodes of head, face and neck: Secondary | ICD-10-CM | POA: Diagnosis not present

## 2014-04-26 DIAGNOSIS — R5383 Other fatigue: Secondary | ICD-10-CM | POA: Diagnosis not present

## 2014-04-29 DIAGNOSIS — R1310 Dysphagia, unspecified: Secondary | ICD-10-CM | POA: Diagnosis not present

## 2014-04-29 DIAGNOSIS — I2510 Atherosclerotic heart disease of native coronary artery without angina pectoris: Secondary | ICD-10-CM | POA: Diagnosis not present

## 2014-04-29 DIAGNOSIS — E119 Type 2 diabetes mellitus without complications: Secondary | ICD-10-CM | POA: Diagnosis not present

## 2014-04-29 DIAGNOSIS — Z510 Encounter for antineoplastic radiation therapy: Secondary | ICD-10-CM | POA: Diagnosis not present

## 2014-04-29 DIAGNOSIS — N289 Disorder of kidney and ureter, unspecified: Secondary | ICD-10-CM | POA: Diagnosis not present

## 2014-04-29 DIAGNOSIS — E1165 Type 2 diabetes mellitus with hyperglycemia: Secondary | ICD-10-CM | POA: Diagnosis not present

## 2014-04-29 DIAGNOSIS — Z5111 Encounter for antineoplastic chemotherapy: Secondary | ICD-10-CM | POA: Diagnosis not present

## 2014-04-29 DIAGNOSIS — Z8673 Personal history of transient ischemic attack (TIA), and cerebral infarction without residual deficits: Secondary | ICD-10-CM | POA: Diagnosis not present

## 2014-04-29 DIAGNOSIS — Z794 Long term (current) use of insulin: Secondary | ICD-10-CM | POA: Diagnosis not present

## 2014-04-29 DIAGNOSIS — I252 Old myocardial infarction: Secondary | ICD-10-CM | POA: Diagnosis not present

## 2014-04-29 DIAGNOSIS — K5900 Constipation, unspecified: Secondary | ICD-10-CM | POA: Diagnosis not present

## 2014-04-29 DIAGNOSIS — Z7982 Long term (current) use of aspirin: Secondary | ICD-10-CM | POA: Diagnosis not present

## 2014-04-29 DIAGNOSIS — C770 Secondary and unspecified malignant neoplasm of lymph nodes of head, face and neck: Secondary | ICD-10-CM | POA: Diagnosis not present

## 2014-04-29 DIAGNOSIS — I10 Essential (primary) hypertension: Secondary | ICD-10-CM | POA: Diagnosis not present

## 2014-04-29 DIAGNOSIS — Z951 Presence of aortocoronary bypass graft: Secondary | ICD-10-CM | POA: Diagnosis not present

## 2014-04-29 DIAGNOSIS — R5383 Other fatigue: Secondary | ICD-10-CM | POA: Diagnosis not present

## 2014-04-29 DIAGNOSIS — R630 Anorexia: Secondary | ICD-10-CM | POA: Diagnosis not present

## 2014-04-29 DIAGNOSIS — Z79899 Other long term (current) drug therapy: Secondary | ICD-10-CM | POA: Diagnosis not present

## 2014-04-29 DIAGNOSIS — C01 Malignant neoplasm of base of tongue: Secondary | ICD-10-CM | POA: Diagnosis not present

## 2014-04-30 DIAGNOSIS — R630 Anorexia: Secondary | ICD-10-CM | POA: Diagnosis not present

## 2014-04-30 DIAGNOSIS — Z510 Encounter for antineoplastic radiation therapy: Secondary | ICD-10-CM | POA: Diagnosis not present

## 2014-04-30 DIAGNOSIS — Z794 Long term (current) use of insulin: Secondary | ICD-10-CM | POA: Diagnosis not present

## 2014-04-30 DIAGNOSIS — R1310 Dysphagia, unspecified: Secondary | ICD-10-CM | POA: Diagnosis not present

## 2014-04-30 DIAGNOSIS — C01 Malignant neoplasm of base of tongue: Secondary | ICD-10-CM | POA: Diagnosis not present

## 2014-04-30 DIAGNOSIS — I2510 Atherosclerotic heart disease of native coronary artery without angina pectoris: Secondary | ICD-10-CM | POA: Diagnosis not present

## 2014-04-30 DIAGNOSIS — N289 Disorder of kidney and ureter, unspecified: Secondary | ICD-10-CM | POA: Diagnosis not present

## 2014-04-30 DIAGNOSIS — Z5111 Encounter for antineoplastic chemotherapy: Secondary | ICD-10-CM | POA: Diagnosis not present

## 2014-04-30 DIAGNOSIS — Z79899 Other long term (current) drug therapy: Secondary | ICD-10-CM | POA: Diagnosis not present

## 2014-04-30 DIAGNOSIS — E1165 Type 2 diabetes mellitus with hyperglycemia: Secondary | ICD-10-CM | POA: Diagnosis not present

## 2014-04-30 DIAGNOSIS — K5900 Constipation, unspecified: Secondary | ICD-10-CM | POA: Diagnosis not present

## 2014-04-30 DIAGNOSIS — I252 Old myocardial infarction: Secondary | ICD-10-CM | POA: Diagnosis not present

## 2014-04-30 DIAGNOSIS — R5383 Other fatigue: Secondary | ICD-10-CM | POA: Diagnosis not present

## 2014-04-30 DIAGNOSIS — C770 Secondary and unspecified malignant neoplasm of lymph nodes of head, face and neck: Secondary | ICD-10-CM | POA: Diagnosis not present

## 2014-04-30 DIAGNOSIS — Z951 Presence of aortocoronary bypass graft: Secondary | ICD-10-CM | POA: Diagnosis not present

## 2014-04-30 DIAGNOSIS — Z7982 Long term (current) use of aspirin: Secondary | ICD-10-CM | POA: Diagnosis not present

## 2014-04-30 DIAGNOSIS — I10 Essential (primary) hypertension: Secondary | ICD-10-CM | POA: Diagnosis not present

## 2014-04-30 DIAGNOSIS — Z8673 Personal history of transient ischemic attack (TIA), and cerebral infarction without residual deficits: Secondary | ICD-10-CM | POA: Diagnosis not present

## 2014-04-30 DIAGNOSIS — E119 Type 2 diabetes mellitus without complications: Secondary | ICD-10-CM | POA: Diagnosis not present

## 2014-05-01 DIAGNOSIS — I10 Essential (primary) hypertension: Secondary | ICD-10-CM | POA: Diagnosis not present

## 2014-05-01 DIAGNOSIS — Z5111 Encounter for antineoplastic chemotherapy: Secondary | ICD-10-CM | POA: Diagnosis not present

## 2014-05-01 DIAGNOSIS — Z794 Long term (current) use of insulin: Secondary | ICD-10-CM | POA: Diagnosis not present

## 2014-05-01 DIAGNOSIS — N289 Disorder of kidney and ureter, unspecified: Secondary | ICD-10-CM | POA: Diagnosis not present

## 2014-05-01 DIAGNOSIS — I252 Old myocardial infarction: Secondary | ICD-10-CM | POA: Diagnosis not present

## 2014-05-01 DIAGNOSIS — C01 Malignant neoplasm of base of tongue: Secondary | ICD-10-CM | POA: Diagnosis not present

## 2014-05-01 DIAGNOSIS — Z510 Encounter for antineoplastic radiation therapy: Secondary | ICD-10-CM | POA: Diagnosis not present

## 2014-05-01 DIAGNOSIS — Z79899 Other long term (current) drug therapy: Secondary | ICD-10-CM | POA: Diagnosis not present

## 2014-05-01 DIAGNOSIS — Z7982 Long term (current) use of aspirin: Secondary | ICD-10-CM | POA: Diagnosis not present

## 2014-05-01 DIAGNOSIS — Z951 Presence of aortocoronary bypass graft: Secondary | ICD-10-CM | POA: Diagnosis not present

## 2014-05-01 DIAGNOSIS — I2510 Atherosclerotic heart disease of native coronary artery without angina pectoris: Secondary | ICD-10-CM | POA: Diagnosis not present

## 2014-05-01 DIAGNOSIS — R5383 Other fatigue: Secondary | ICD-10-CM | POA: Diagnosis not present

## 2014-05-01 DIAGNOSIS — E1165 Type 2 diabetes mellitus with hyperglycemia: Secondary | ICD-10-CM | POA: Diagnosis not present

## 2014-05-01 DIAGNOSIS — R630 Anorexia: Secondary | ICD-10-CM | POA: Diagnosis not present

## 2014-05-01 DIAGNOSIS — Z8673 Personal history of transient ischemic attack (TIA), and cerebral infarction without residual deficits: Secondary | ICD-10-CM | POA: Diagnosis not present

## 2014-05-01 DIAGNOSIS — C770 Secondary and unspecified malignant neoplasm of lymph nodes of head, face and neck: Secondary | ICD-10-CM | POA: Diagnosis not present

## 2014-05-01 DIAGNOSIS — K5900 Constipation, unspecified: Secondary | ICD-10-CM | POA: Diagnosis not present

## 2014-05-01 DIAGNOSIS — R1310 Dysphagia, unspecified: Secondary | ICD-10-CM | POA: Diagnosis not present

## 2014-05-01 DIAGNOSIS — E119 Type 2 diabetes mellitus without complications: Secondary | ICD-10-CM | POA: Diagnosis not present

## 2014-05-02 DIAGNOSIS — R630 Anorexia: Secondary | ICD-10-CM | POA: Diagnosis not present

## 2014-05-02 DIAGNOSIS — Z7982 Long term (current) use of aspirin: Secondary | ICD-10-CM | POA: Diagnosis not present

## 2014-05-02 DIAGNOSIS — Z8673 Personal history of transient ischemic attack (TIA), and cerebral infarction without residual deficits: Secondary | ICD-10-CM | POA: Diagnosis not present

## 2014-05-02 DIAGNOSIS — E119 Type 2 diabetes mellitus without complications: Secondary | ICD-10-CM | POA: Diagnosis not present

## 2014-05-02 DIAGNOSIS — C770 Secondary and unspecified malignant neoplasm of lymph nodes of head, face and neck: Secondary | ICD-10-CM | POA: Diagnosis not present

## 2014-05-02 DIAGNOSIS — R1310 Dysphagia, unspecified: Secondary | ICD-10-CM | POA: Diagnosis not present

## 2014-05-02 DIAGNOSIS — I252 Old myocardial infarction: Secondary | ICD-10-CM | POA: Diagnosis not present

## 2014-05-02 DIAGNOSIS — R5383 Other fatigue: Secondary | ICD-10-CM | POA: Diagnosis not present

## 2014-05-02 DIAGNOSIS — Z510 Encounter for antineoplastic radiation therapy: Secondary | ICD-10-CM | POA: Diagnosis not present

## 2014-05-02 DIAGNOSIS — Z79899 Other long term (current) drug therapy: Secondary | ICD-10-CM | POA: Diagnosis not present

## 2014-05-02 DIAGNOSIS — Z794 Long term (current) use of insulin: Secondary | ICD-10-CM | POA: Diagnosis not present

## 2014-05-02 DIAGNOSIS — K5900 Constipation, unspecified: Secondary | ICD-10-CM | POA: Diagnosis not present

## 2014-05-02 DIAGNOSIS — C01 Malignant neoplasm of base of tongue: Secondary | ICD-10-CM | POA: Diagnosis not present

## 2014-05-02 DIAGNOSIS — Z951 Presence of aortocoronary bypass graft: Secondary | ICD-10-CM | POA: Diagnosis not present

## 2014-05-02 DIAGNOSIS — E1165 Type 2 diabetes mellitus with hyperglycemia: Secondary | ICD-10-CM | POA: Diagnosis not present

## 2014-05-02 DIAGNOSIS — I10 Essential (primary) hypertension: Secondary | ICD-10-CM | POA: Diagnosis not present

## 2014-05-02 DIAGNOSIS — I2510 Atherosclerotic heart disease of native coronary artery without angina pectoris: Secondary | ICD-10-CM | POA: Diagnosis not present

## 2014-05-02 DIAGNOSIS — N289 Disorder of kidney and ureter, unspecified: Secondary | ICD-10-CM | POA: Diagnosis not present

## 2014-05-02 DIAGNOSIS — Z5111 Encounter for antineoplastic chemotherapy: Secondary | ICD-10-CM | POA: Diagnosis not present

## 2014-05-02 LAB — CBC CANCER CENTER
BASOS PCT: 0.4 %
Basophil #: 0 x10 3/mm (ref 0.0–0.1)
Eosinophil #: 0.1 x10 3/mm (ref 0.0–0.7)
Eosinophil %: 2.2 %
HCT: 41.9 % (ref 40.0–52.0)
HGB: 14 g/dL (ref 13.0–18.0)
Lymphocyte #: 1.1 x10 3/mm (ref 1.0–3.6)
Lymphocyte %: 18.9 %
MCH: 30.6 pg (ref 26.0–34.0)
MCHC: 33.3 g/dL (ref 32.0–36.0)
MCV: 92 fL (ref 80–100)
Monocyte #: 0.6 x10 3/mm (ref 0.2–1.0)
Monocyte %: 9.6 %
NEUTROS PCT: 68.9 %
Neutrophil #: 4.1 x10 3/mm (ref 1.4–6.5)
Platelet: 176 x10 3/mm (ref 150–440)
RBC: 4.56 10*6/uL (ref 4.40–5.90)
RDW: 15.2 % — ABNORMAL HIGH (ref 11.5–14.5)
WBC: 6 x10 3/mm (ref 3.8–10.6)

## 2014-05-03 DIAGNOSIS — E1165 Type 2 diabetes mellitus with hyperglycemia: Secondary | ICD-10-CM | POA: Diagnosis not present

## 2014-05-03 DIAGNOSIS — C770 Secondary and unspecified malignant neoplasm of lymph nodes of head, face and neck: Secondary | ICD-10-CM | POA: Diagnosis not present

## 2014-05-03 DIAGNOSIS — Z794 Long term (current) use of insulin: Secondary | ICD-10-CM | POA: Diagnosis not present

## 2014-05-03 DIAGNOSIS — Z951 Presence of aortocoronary bypass graft: Secondary | ICD-10-CM | POA: Diagnosis not present

## 2014-05-03 DIAGNOSIS — Z5111 Encounter for antineoplastic chemotherapy: Secondary | ICD-10-CM | POA: Diagnosis not present

## 2014-05-03 DIAGNOSIS — R630 Anorexia: Secondary | ICD-10-CM | POA: Diagnosis not present

## 2014-05-03 DIAGNOSIS — Z79899 Other long term (current) drug therapy: Secondary | ICD-10-CM | POA: Diagnosis not present

## 2014-05-03 DIAGNOSIS — R1310 Dysphagia, unspecified: Secondary | ICD-10-CM | POA: Diagnosis not present

## 2014-05-03 DIAGNOSIS — K5900 Constipation, unspecified: Secondary | ICD-10-CM | POA: Diagnosis not present

## 2014-05-03 DIAGNOSIS — E119 Type 2 diabetes mellitus without complications: Secondary | ICD-10-CM | POA: Diagnosis not present

## 2014-05-03 DIAGNOSIS — N289 Disorder of kidney and ureter, unspecified: Secondary | ICD-10-CM | POA: Diagnosis not present

## 2014-05-03 DIAGNOSIS — I2510 Atherosclerotic heart disease of native coronary artery without angina pectoris: Secondary | ICD-10-CM | POA: Diagnosis not present

## 2014-05-03 DIAGNOSIS — Z7982 Long term (current) use of aspirin: Secondary | ICD-10-CM | POA: Diagnosis not present

## 2014-05-03 DIAGNOSIS — R5383 Other fatigue: Secondary | ICD-10-CM | POA: Diagnosis not present

## 2014-05-03 DIAGNOSIS — Z8673 Personal history of transient ischemic attack (TIA), and cerebral infarction without residual deficits: Secondary | ICD-10-CM | POA: Diagnosis not present

## 2014-05-03 DIAGNOSIS — I10 Essential (primary) hypertension: Secondary | ICD-10-CM | POA: Diagnosis not present

## 2014-05-03 DIAGNOSIS — C01 Malignant neoplasm of base of tongue: Secondary | ICD-10-CM | POA: Diagnosis not present

## 2014-05-03 DIAGNOSIS — Z510 Encounter for antineoplastic radiation therapy: Secondary | ICD-10-CM | POA: Diagnosis not present

## 2014-05-03 DIAGNOSIS — I252 Old myocardial infarction: Secondary | ICD-10-CM | POA: Diagnosis not present

## 2014-05-03 LAB — BASIC METABOLIC PANEL
Anion Gap: 4 — ABNORMAL LOW (ref 7–16)
BUN: 29 mg/dL — AB
CO2: 29 mmol/L
CREATININE: 1.11 mg/dL
Calcium, Total: 9.4 mg/dL
Chloride: 105 mmol/L
EGFR (Non-African Amer.): >60
GLUCOSE: 141 mg/dL — AB
POTASSIUM: 4.7 mmol/L
SODIUM: 138 mmol/L

## 2014-05-06 DIAGNOSIS — E119 Type 2 diabetes mellitus without complications: Secondary | ICD-10-CM | POA: Diagnosis not present

## 2014-05-06 DIAGNOSIS — I10 Essential (primary) hypertension: Secondary | ICD-10-CM | POA: Diagnosis not present

## 2014-05-06 DIAGNOSIS — M5412 Radiculopathy, cervical region: Secondary | ICD-10-CM | POA: Diagnosis not present

## 2014-05-06 DIAGNOSIS — I252 Old myocardial infarction: Secondary | ICD-10-CM | POA: Diagnosis not present

## 2014-05-06 DIAGNOSIS — R5383 Other fatigue: Secondary | ICD-10-CM | POA: Diagnosis not present

## 2014-05-06 DIAGNOSIS — Z5111 Encounter for antineoplastic chemotherapy: Secondary | ICD-10-CM | POA: Diagnosis not present

## 2014-05-06 DIAGNOSIS — Z510 Encounter for antineoplastic radiation therapy: Secondary | ICD-10-CM | POA: Diagnosis not present

## 2014-05-06 DIAGNOSIS — R1310 Dysphagia, unspecified: Secondary | ICD-10-CM | POA: Diagnosis not present

## 2014-05-06 DIAGNOSIS — I2510 Atherosclerotic heart disease of native coronary artery without angina pectoris: Secondary | ICD-10-CM | POA: Diagnosis not present

## 2014-05-06 DIAGNOSIS — Z8673 Personal history of transient ischemic attack (TIA), and cerebral infarction without residual deficits: Secondary | ICD-10-CM | POA: Diagnosis not present

## 2014-05-06 DIAGNOSIS — C770 Secondary and unspecified malignant neoplasm of lymph nodes of head, face and neck: Secondary | ICD-10-CM | POA: Diagnosis not present

## 2014-05-06 DIAGNOSIS — Z79899 Other long term (current) drug therapy: Secondary | ICD-10-CM | POA: Diagnosis not present

## 2014-05-06 DIAGNOSIS — E1165 Type 2 diabetes mellitus with hyperglycemia: Secondary | ICD-10-CM | POA: Diagnosis not present

## 2014-05-06 DIAGNOSIS — Z951 Presence of aortocoronary bypass graft: Secondary | ICD-10-CM | POA: Diagnosis not present

## 2014-05-06 DIAGNOSIS — C01 Malignant neoplasm of base of tongue: Secondary | ICD-10-CM | POA: Diagnosis not present

## 2014-05-06 DIAGNOSIS — R630 Anorexia: Secondary | ICD-10-CM | POA: Diagnosis not present

## 2014-05-06 DIAGNOSIS — Z7982 Long term (current) use of aspirin: Secondary | ICD-10-CM | POA: Diagnosis not present

## 2014-05-06 DIAGNOSIS — Z794 Long term (current) use of insulin: Secondary | ICD-10-CM | POA: Diagnosis not present

## 2014-05-06 DIAGNOSIS — N289 Disorder of kidney and ureter, unspecified: Secondary | ICD-10-CM | POA: Diagnosis not present

## 2014-05-06 DIAGNOSIS — K5900 Constipation, unspecified: Secondary | ICD-10-CM | POA: Diagnosis not present

## 2014-05-07 DIAGNOSIS — I2510 Atherosclerotic heart disease of native coronary artery without angina pectoris: Secondary | ICD-10-CM | POA: Diagnosis not present

## 2014-05-07 DIAGNOSIS — R5383 Other fatigue: Secondary | ICD-10-CM | POA: Diagnosis not present

## 2014-05-07 DIAGNOSIS — R1310 Dysphagia, unspecified: Secondary | ICD-10-CM | POA: Diagnosis not present

## 2014-05-07 DIAGNOSIS — Z8673 Personal history of transient ischemic attack (TIA), and cerebral infarction without residual deficits: Secondary | ICD-10-CM | POA: Diagnosis not present

## 2014-05-07 DIAGNOSIS — I10 Essential (primary) hypertension: Secondary | ICD-10-CM | POA: Diagnosis not present

## 2014-05-07 DIAGNOSIS — I252 Old myocardial infarction: Secondary | ICD-10-CM | POA: Diagnosis not present

## 2014-05-07 DIAGNOSIS — E1165 Type 2 diabetes mellitus with hyperglycemia: Secondary | ICD-10-CM | POA: Diagnosis not present

## 2014-05-07 DIAGNOSIS — C01 Malignant neoplasm of base of tongue: Secondary | ICD-10-CM | POA: Diagnosis not present

## 2014-05-07 DIAGNOSIS — E119 Type 2 diabetes mellitus without complications: Secondary | ICD-10-CM | POA: Diagnosis not present

## 2014-05-07 DIAGNOSIS — Z794 Long term (current) use of insulin: Secondary | ICD-10-CM | POA: Diagnosis not present

## 2014-05-07 DIAGNOSIS — C770 Secondary and unspecified malignant neoplasm of lymph nodes of head, face and neck: Secondary | ICD-10-CM | POA: Diagnosis not present

## 2014-05-07 DIAGNOSIS — Z79899 Other long term (current) drug therapy: Secondary | ICD-10-CM | POA: Diagnosis not present

## 2014-05-07 DIAGNOSIS — R630 Anorexia: Secondary | ICD-10-CM | POA: Diagnosis not present

## 2014-05-07 DIAGNOSIS — Z5111 Encounter for antineoplastic chemotherapy: Secondary | ICD-10-CM | POA: Diagnosis not present

## 2014-05-07 DIAGNOSIS — Z951 Presence of aortocoronary bypass graft: Secondary | ICD-10-CM | POA: Diagnosis not present

## 2014-05-07 DIAGNOSIS — K5900 Constipation, unspecified: Secondary | ICD-10-CM | POA: Diagnosis not present

## 2014-05-07 DIAGNOSIS — Z510 Encounter for antineoplastic radiation therapy: Secondary | ICD-10-CM | POA: Diagnosis not present

## 2014-05-07 DIAGNOSIS — Z7982 Long term (current) use of aspirin: Secondary | ICD-10-CM | POA: Diagnosis not present

## 2014-05-07 DIAGNOSIS — N289 Disorder of kidney and ureter, unspecified: Secondary | ICD-10-CM | POA: Diagnosis not present

## 2014-05-08 DIAGNOSIS — E1165 Type 2 diabetes mellitus with hyperglycemia: Secondary | ICD-10-CM | POA: Diagnosis not present

## 2014-05-08 DIAGNOSIS — Z794 Long term (current) use of insulin: Secondary | ICD-10-CM | POA: Diagnosis not present

## 2014-05-08 DIAGNOSIS — C770 Secondary and unspecified malignant neoplasm of lymph nodes of head, face and neck: Secondary | ICD-10-CM | POA: Diagnosis not present

## 2014-05-08 DIAGNOSIS — N289 Disorder of kidney and ureter, unspecified: Secondary | ICD-10-CM | POA: Diagnosis not present

## 2014-05-08 DIAGNOSIS — C01 Malignant neoplasm of base of tongue: Secondary | ICD-10-CM | POA: Diagnosis not present

## 2014-05-08 DIAGNOSIS — K5900 Constipation, unspecified: Secondary | ICD-10-CM | POA: Diagnosis not present

## 2014-05-08 DIAGNOSIS — Z8673 Personal history of transient ischemic attack (TIA), and cerebral infarction without residual deficits: Secondary | ICD-10-CM | POA: Diagnosis not present

## 2014-05-08 DIAGNOSIS — Z7982 Long term (current) use of aspirin: Secondary | ICD-10-CM | POA: Diagnosis not present

## 2014-05-08 DIAGNOSIS — Z510 Encounter for antineoplastic radiation therapy: Secondary | ICD-10-CM | POA: Diagnosis not present

## 2014-05-08 DIAGNOSIS — I10 Essential (primary) hypertension: Secondary | ICD-10-CM | POA: Diagnosis not present

## 2014-05-08 DIAGNOSIS — Z79899 Other long term (current) drug therapy: Secondary | ICD-10-CM | POA: Diagnosis not present

## 2014-05-08 DIAGNOSIS — Z5111 Encounter for antineoplastic chemotherapy: Secondary | ICD-10-CM | POA: Diagnosis not present

## 2014-05-08 DIAGNOSIS — Z951 Presence of aortocoronary bypass graft: Secondary | ICD-10-CM | POA: Diagnosis not present

## 2014-05-08 DIAGNOSIS — I252 Old myocardial infarction: Secondary | ICD-10-CM | POA: Diagnosis not present

## 2014-05-08 DIAGNOSIS — R5383 Other fatigue: Secondary | ICD-10-CM | POA: Diagnosis not present

## 2014-05-08 DIAGNOSIS — E119 Type 2 diabetes mellitus without complications: Secondary | ICD-10-CM | POA: Diagnosis not present

## 2014-05-08 DIAGNOSIS — I2510 Atherosclerotic heart disease of native coronary artery without angina pectoris: Secondary | ICD-10-CM | POA: Diagnosis not present

## 2014-05-08 DIAGNOSIS — R1310 Dysphagia, unspecified: Secondary | ICD-10-CM | POA: Diagnosis not present

## 2014-05-08 DIAGNOSIS — R630 Anorexia: Secondary | ICD-10-CM | POA: Diagnosis not present

## 2014-05-08 LAB — BASIC METABOLIC PANEL
Anion Gap: 8 (ref 7–16)
BUN: 24 mg/dL — ABNORMAL HIGH
CREATININE: 1.05 mg/dL
Calcium, Total: 9 mg/dL
Chloride: 104 mmol/L
Co2: 27 mmol/L
EGFR (African American): >60
EGFR (Non-African Amer.): >60
GLUCOSE: 83 mg/dL
Potassium: 4 mmol/L
SODIUM: 139 mmol/L

## 2014-05-08 LAB — CBC CANCER CENTER
BASOS ABS: 0 x10 3/mm (ref 0.0–0.1)
Basophil %: 0.3 %
Eosinophil #: 0.2 x10 3/mm (ref 0.0–0.7)
Eosinophil %: 2.7 %
HCT: 42.4 % (ref 40.0–52.0)
HGB: 14.1 g/dL (ref 13.0–18.0)
LYMPHS ABS: 1.1 x10 3/mm (ref 1.0–3.6)
Lymphocyte %: 13.9 %
MCH: 30.7 pg (ref 26.0–34.0)
MCHC: 33.1 g/dL (ref 32.0–36.0)
MCV: 93 fL (ref 80–100)
MONO ABS: 0.8 x10 3/mm (ref 0.2–1.0)
Monocyte %: 10.2 %
Neutrophil #: 5.7 x10 3/mm (ref 1.4–6.5)
Neutrophil %: 72.9 %
PLATELETS: 149 x10 3/mm — AB (ref 150–440)
RBC: 4.58 10*6/uL (ref 4.40–5.90)
RDW: 16 % — ABNORMAL HIGH (ref 11.5–14.5)
WBC: 7.8 x10 3/mm (ref 3.8–10.6)

## 2014-05-08 LAB — CREATININE, SERUM: CREATINE, SERUM: 1.05

## 2014-05-09 DIAGNOSIS — N289 Disorder of kidney and ureter, unspecified: Secondary | ICD-10-CM | POA: Diagnosis not present

## 2014-05-09 DIAGNOSIS — R1310 Dysphagia, unspecified: Secondary | ICD-10-CM | POA: Diagnosis not present

## 2014-05-09 DIAGNOSIS — C770 Secondary and unspecified malignant neoplasm of lymph nodes of head, face and neck: Secondary | ICD-10-CM | POA: Diagnosis not present

## 2014-05-09 DIAGNOSIS — Z794 Long term (current) use of insulin: Secondary | ICD-10-CM | POA: Diagnosis not present

## 2014-05-09 DIAGNOSIS — Z79899 Other long term (current) drug therapy: Secondary | ICD-10-CM | POA: Diagnosis not present

## 2014-05-09 DIAGNOSIS — R630 Anorexia: Secondary | ICD-10-CM | POA: Diagnosis not present

## 2014-05-09 DIAGNOSIS — I252 Old myocardial infarction: Secondary | ICD-10-CM | POA: Diagnosis not present

## 2014-05-09 DIAGNOSIS — Z5111 Encounter for antineoplastic chemotherapy: Secondary | ICD-10-CM | POA: Diagnosis not present

## 2014-05-09 DIAGNOSIS — Z951 Presence of aortocoronary bypass graft: Secondary | ICD-10-CM | POA: Diagnosis not present

## 2014-05-09 DIAGNOSIS — C01 Malignant neoplasm of base of tongue: Secondary | ICD-10-CM | POA: Diagnosis not present

## 2014-05-09 DIAGNOSIS — K5900 Constipation, unspecified: Secondary | ICD-10-CM | POA: Diagnosis not present

## 2014-05-09 DIAGNOSIS — I2510 Atherosclerotic heart disease of native coronary artery without angina pectoris: Secondary | ICD-10-CM | POA: Diagnosis not present

## 2014-05-09 DIAGNOSIS — Z7982 Long term (current) use of aspirin: Secondary | ICD-10-CM | POA: Diagnosis not present

## 2014-05-09 DIAGNOSIS — I10 Essential (primary) hypertension: Secondary | ICD-10-CM | POA: Diagnosis not present

## 2014-05-09 DIAGNOSIS — Z510 Encounter for antineoplastic radiation therapy: Secondary | ICD-10-CM | POA: Diagnosis not present

## 2014-05-09 DIAGNOSIS — R5383 Other fatigue: Secondary | ICD-10-CM | POA: Diagnosis not present

## 2014-05-09 DIAGNOSIS — Z8673 Personal history of transient ischemic attack (TIA), and cerebral infarction without residual deficits: Secondary | ICD-10-CM | POA: Diagnosis not present

## 2014-05-09 DIAGNOSIS — E119 Type 2 diabetes mellitus without complications: Secondary | ICD-10-CM | POA: Diagnosis not present

## 2014-05-09 DIAGNOSIS — E1165 Type 2 diabetes mellitus with hyperglycemia: Secondary | ICD-10-CM | POA: Diagnosis not present

## 2014-05-10 ENCOUNTER — Ambulatory Visit
Admit: 2014-05-10 | Disposition: A | Discharge status: Home / Self Care | Payer: Self-pay | Attending: Oncology | Admitting: Oncology

## 2014-05-10 DIAGNOSIS — Z79899 Other long term (current) drug therapy: Secondary | ICD-10-CM | POA: Diagnosis not present

## 2014-05-10 DIAGNOSIS — R1310 Dysphagia, unspecified: Secondary | ICD-10-CM | POA: Diagnosis not present

## 2014-05-10 DIAGNOSIS — Z5111 Encounter for antineoplastic chemotherapy: Secondary | ICD-10-CM | POA: Diagnosis not present

## 2014-05-10 DIAGNOSIS — Z8673 Personal history of transient ischemic attack (TIA), and cerebral infarction without residual deficits: Secondary | ICD-10-CM | POA: Diagnosis not present

## 2014-05-10 DIAGNOSIS — I252 Old myocardial infarction: Secondary | ICD-10-CM | POA: Diagnosis not present

## 2014-05-10 DIAGNOSIS — Z510 Encounter for antineoplastic radiation therapy: Secondary | ICD-10-CM | POA: Diagnosis not present

## 2014-05-10 DIAGNOSIS — C01 Malignant neoplasm of base of tongue: Secondary | ICD-10-CM | POA: Diagnosis not present

## 2014-05-10 DIAGNOSIS — R630 Anorexia: Secondary | ICD-10-CM | POA: Diagnosis not present

## 2014-05-10 DIAGNOSIS — N289 Disorder of kidney and ureter, unspecified: Secondary | ICD-10-CM | POA: Diagnosis not present

## 2014-05-10 DIAGNOSIS — Z951 Presence of aortocoronary bypass graft: Secondary | ICD-10-CM | POA: Diagnosis not present

## 2014-05-10 DIAGNOSIS — R5383 Other fatigue: Secondary | ICD-10-CM | POA: Diagnosis not present

## 2014-05-10 DIAGNOSIS — K5900 Constipation, unspecified: Secondary | ICD-10-CM | POA: Diagnosis not present

## 2014-05-10 DIAGNOSIS — I2510 Atherosclerotic heart disease of native coronary artery without angina pectoris: Secondary | ICD-10-CM | POA: Diagnosis not present

## 2014-05-10 DIAGNOSIS — C770 Secondary and unspecified malignant neoplasm of lymph nodes of head, face and neck: Secondary | ICD-10-CM | POA: Diagnosis not present

## 2014-05-10 DIAGNOSIS — I10 Essential (primary) hypertension: Secondary | ICD-10-CM | POA: Diagnosis not present

## 2014-05-10 DIAGNOSIS — Z7982 Long term (current) use of aspirin: Secondary | ICD-10-CM | POA: Diagnosis not present

## 2014-05-10 DIAGNOSIS — E119 Type 2 diabetes mellitus without complications: Secondary | ICD-10-CM | POA: Diagnosis not present

## 2014-05-10 DIAGNOSIS — Z794 Long term (current) use of insulin: Secondary | ICD-10-CM | POA: Diagnosis not present

## 2014-05-14 DIAGNOSIS — I10 Essential (primary) hypertension: Secondary | ICD-10-CM | POA: Diagnosis not present

## 2014-05-14 DIAGNOSIS — I25709 Atherosclerosis of coronary artery bypass graft(s), unspecified, with unspecified angina pectoris: Secondary | ICD-10-CM

## 2014-05-14 DIAGNOSIS — Z7982 Long term (current) use of aspirin: Secondary | ICD-10-CM | POA: Diagnosis not present

## 2014-05-14 DIAGNOSIS — Z79899 Other long term (current) drug therapy: Secondary | ICD-10-CM | POA: Diagnosis not present

## 2014-05-14 DIAGNOSIS — R1310 Dysphagia, unspecified: Secondary | ICD-10-CM | POA: Diagnosis not present

## 2014-05-14 DIAGNOSIS — Z794 Long term (current) use of insulin: Secondary | ICD-10-CM | POA: Diagnosis not present

## 2014-05-14 DIAGNOSIS — E1110 Type 2 diabetes mellitus with ketoacidosis without coma: Secondary | ICD-10-CM

## 2014-05-14 DIAGNOSIS — Z510 Encounter for antineoplastic radiation therapy: Secondary | ICD-10-CM | POA: Diagnosis not present

## 2014-05-14 DIAGNOSIS — Z951 Presence of aortocoronary bypass graft: Secondary | ICD-10-CM | POA: Diagnosis not present

## 2014-05-14 DIAGNOSIS — N289 Disorder of kidney and ureter, unspecified: Secondary | ICD-10-CM | POA: Diagnosis not present

## 2014-05-14 DIAGNOSIS — E119 Type 2 diabetes mellitus without complications: Secondary | ICD-10-CM | POA: Diagnosis not present

## 2014-05-14 DIAGNOSIS — I25810 Atherosclerosis of coronary artery bypass graft(s) without angina pectoris: Secondary | ICD-10-CM | POA: Insufficient documentation

## 2014-05-14 DIAGNOSIS — C029 Malignant neoplasm of tongue, unspecified: Secondary | ICD-10-CM | POA: Insufficient documentation

## 2014-05-14 DIAGNOSIS — R630 Anorexia: Secondary | ICD-10-CM | POA: Diagnosis not present

## 2014-05-14 DIAGNOSIS — I639 Cerebral infarction, unspecified: Secondary | ICD-10-CM | POA: Insufficient documentation

## 2014-05-14 DIAGNOSIS — I2510 Atherosclerotic heart disease of native coronary artery without angina pectoris: Secondary | ICD-10-CM | POA: Diagnosis not present

## 2014-05-14 DIAGNOSIS — K5900 Constipation, unspecified: Secondary | ICD-10-CM | POA: Diagnosis not present

## 2014-05-14 DIAGNOSIS — C01 Malignant neoplasm of base of tongue: Secondary | ICD-10-CM | POA: Diagnosis not present

## 2014-05-14 DIAGNOSIS — Z8673 Personal history of transient ischemic attack (TIA), and cerebral infarction without residual deficits: Secondary | ICD-10-CM | POA: Diagnosis not present

## 2014-05-14 DIAGNOSIS — R5383 Other fatigue: Secondary | ICD-10-CM | POA: Diagnosis not present

## 2014-05-14 DIAGNOSIS — Z9889 Other specified postprocedural states: Secondary | ICD-10-CM

## 2014-05-14 DIAGNOSIS — Z5111 Encounter for antineoplastic chemotherapy: Secondary | ICD-10-CM | POA: Diagnosis not present

## 2014-05-14 DIAGNOSIS — C770 Secondary and unspecified malignant neoplasm of lymph nodes of head, face and neck: Secondary | ICD-10-CM | POA: Diagnosis not present

## 2014-05-14 DIAGNOSIS — I252 Old myocardial infarction: Secondary | ICD-10-CM | POA: Diagnosis not present

## 2014-05-16 DIAGNOSIS — K5900 Constipation, unspecified: Secondary | ICD-10-CM | POA: Diagnosis not present

## 2014-05-16 DIAGNOSIS — Z5111 Encounter for antineoplastic chemotherapy: Secondary | ICD-10-CM | POA: Diagnosis not present

## 2014-05-16 DIAGNOSIS — I252 Old myocardial infarction: Secondary | ICD-10-CM | POA: Diagnosis not present

## 2014-05-16 DIAGNOSIS — R630 Anorexia: Secondary | ICD-10-CM | POA: Diagnosis not present

## 2014-05-16 DIAGNOSIS — Z79899 Other long term (current) drug therapy: Secondary | ICD-10-CM | POA: Diagnosis not present

## 2014-05-16 DIAGNOSIS — E119 Type 2 diabetes mellitus without complications: Secondary | ICD-10-CM | POA: Diagnosis not present

## 2014-05-16 DIAGNOSIS — R5383 Other fatigue: Secondary | ICD-10-CM | POA: Diagnosis not present

## 2014-05-16 DIAGNOSIS — C01 Malignant neoplasm of base of tongue: Secondary | ICD-10-CM | POA: Diagnosis not present

## 2014-05-16 DIAGNOSIS — Z951 Presence of aortocoronary bypass graft: Secondary | ICD-10-CM | POA: Diagnosis not present

## 2014-05-16 DIAGNOSIS — I10 Essential (primary) hypertension: Secondary | ICD-10-CM | POA: Diagnosis not present

## 2014-05-16 DIAGNOSIS — Z794 Long term (current) use of insulin: Secondary | ICD-10-CM | POA: Diagnosis not present

## 2014-05-16 DIAGNOSIS — I2510 Atherosclerotic heart disease of native coronary artery without angina pectoris: Secondary | ICD-10-CM | POA: Diagnosis not present

## 2014-05-16 DIAGNOSIS — Z8673 Personal history of transient ischemic attack (TIA), and cerebral infarction without residual deficits: Secondary | ICD-10-CM | POA: Diagnosis not present

## 2014-05-16 DIAGNOSIS — R1310 Dysphagia, unspecified: Secondary | ICD-10-CM | POA: Diagnosis not present

## 2014-05-16 DIAGNOSIS — Z510 Encounter for antineoplastic radiation therapy: Secondary | ICD-10-CM | POA: Diagnosis not present

## 2014-05-16 DIAGNOSIS — Z7982 Long term (current) use of aspirin: Secondary | ICD-10-CM | POA: Diagnosis not present

## 2014-05-16 DIAGNOSIS — N289 Disorder of kidney and ureter, unspecified: Secondary | ICD-10-CM | POA: Diagnosis not present

## 2014-05-16 DIAGNOSIS — C770 Secondary and unspecified malignant neoplasm of lymph nodes of head, face and neck: Secondary | ICD-10-CM | POA: Diagnosis not present

## 2014-05-17 DIAGNOSIS — C770 Secondary and unspecified malignant neoplasm of lymph nodes of head, face and neck: Secondary | ICD-10-CM | POA: Diagnosis not present

## 2014-05-17 DIAGNOSIS — Z8673 Personal history of transient ischemic attack (TIA), and cerebral infarction without residual deficits: Secondary | ICD-10-CM | POA: Diagnosis not present

## 2014-05-17 DIAGNOSIS — R630 Anorexia: Secondary | ICD-10-CM | POA: Diagnosis not present

## 2014-05-17 DIAGNOSIS — E119 Type 2 diabetes mellitus without complications: Secondary | ICD-10-CM | POA: Diagnosis not present

## 2014-05-17 DIAGNOSIS — K5900 Constipation, unspecified: Secondary | ICD-10-CM | POA: Diagnosis not present

## 2014-05-17 DIAGNOSIS — I252 Old myocardial infarction: Secondary | ICD-10-CM | POA: Diagnosis not present

## 2014-05-17 DIAGNOSIS — Z794 Long term (current) use of insulin: Secondary | ICD-10-CM | POA: Diagnosis not present

## 2014-05-17 DIAGNOSIS — I2510 Atherosclerotic heart disease of native coronary artery without angina pectoris: Secondary | ICD-10-CM | POA: Diagnosis not present

## 2014-05-17 DIAGNOSIS — Z7982 Long term (current) use of aspirin: Secondary | ICD-10-CM | POA: Diagnosis not present

## 2014-05-17 DIAGNOSIS — R5383 Other fatigue: Secondary | ICD-10-CM | POA: Diagnosis not present

## 2014-05-17 DIAGNOSIS — Z79899 Other long term (current) drug therapy: Secondary | ICD-10-CM | POA: Diagnosis not present

## 2014-05-17 DIAGNOSIS — N289 Disorder of kidney and ureter, unspecified: Secondary | ICD-10-CM | POA: Diagnosis not present

## 2014-05-17 DIAGNOSIS — I10 Essential (primary) hypertension: Secondary | ICD-10-CM | POA: Diagnosis not present

## 2014-05-17 DIAGNOSIS — Z510 Encounter for antineoplastic radiation therapy: Secondary | ICD-10-CM | POA: Diagnosis not present

## 2014-05-17 DIAGNOSIS — Z5111 Encounter for antineoplastic chemotherapy: Secondary | ICD-10-CM | POA: Diagnosis not present

## 2014-05-17 DIAGNOSIS — Z951 Presence of aortocoronary bypass graft: Secondary | ICD-10-CM | POA: Diagnosis not present

## 2014-05-17 DIAGNOSIS — R1310 Dysphagia, unspecified: Secondary | ICD-10-CM | POA: Diagnosis not present

## 2014-05-17 DIAGNOSIS — C01 Malignant neoplasm of base of tongue: Secondary | ICD-10-CM | POA: Diagnosis not present

## 2014-05-20 DIAGNOSIS — Z79899 Other long term (current) drug therapy: Secondary | ICD-10-CM | POA: Diagnosis not present

## 2014-05-20 DIAGNOSIS — Z510 Encounter for antineoplastic radiation therapy: Secondary | ICD-10-CM | POA: Diagnosis not present

## 2014-05-20 DIAGNOSIS — K5900 Constipation, unspecified: Secondary | ICD-10-CM | POA: Diagnosis not present

## 2014-05-20 DIAGNOSIS — E119 Type 2 diabetes mellitus without complications: Secondary | ICD-10-CM | POA: Diagnosis not present

## 2014-05-20 DIAGNOSIS — R5383 Other fatigue: Secondary | ICD-10-CM | POA: Diagnosis not present

## 2014-05-20 DIAGNOSIS — I10 Essential (primary) hypertension: Secondary | ICD-10-CM | POA: Diagnosis not present

## 2014-05-20 DIAGNOSIS — I252 Old myocardial infarction: Secondary | ICD-10-CM | POA: Diagnosis not present

## 2014-05-20 DIAGNOSIS — N289 Disorder of kidney and ureter, unspecified: Secondary | ICD-10-CM | POA: Diagnosis not present

## 2014-05-20 DIAGNOSIS — Z8673 Personal history of transient ischemic attack (TIA), and cerebral infarction without residual deficits: Secondary | ICD-10-CM | POA: Diagnosis not present

## 2014-05-20 DIAGNOSIS — R630 Anorexia: Secondary | ICD-10-CM | POA: Diagnosis not present

## 2014-05-20 DIAGNOSIS — Z794 Long term (current) use of insulin: Secondary | ICD-10-CM | POA: Diagnosis not present

## 2014-05-20 DIAGNOSIS — C01 Malignant neoplasm of base of tongue: Secondary | ICD-10-CM | POA: Diagnosis not present

## 2014-05-20 DIAGNOSIS — Z5111 Encounter for antineoplastic chemotherapy: Secondary | ICD-10-CM | POA: Diagnosis not present

## 2014-05-20 DIAGNOSIS — R1310 Dysphagia, unspecified: Secondary | ICD-10-CM | POA: Diagnosis not present

## 2014-05-20 DIAGNOSIS — Z7982 Long term (current) use of aspirin: Secondary | ICD-10-CM | POA: Diagnosis not present

## 2014-05-20 DIAGNOSIS — Z951 Presence of aortocoronary bypass graft: Secondary | ICD-10-CM | POA: Diagnosis not present

## 2014-05-20 DIAGNOSIS — C770 Secondary and unspecified malignant neoplasm of lymph nodes of head, face and neck: Secondary | ICD-10-CM | POA: Diagnosis not present

## 2014-05-20 DIAGNOSIS — I2510 Atherosclerotic heart disease of native coronary artery without angina pectoris: Secondary | ICD-10-CM | POA: Diagnosis not present

## 2014-05-21 DIAGNOSIS — E119 Type 2 diabetes mellitus without complications: Secondary | ICD-10-CM | POA: Diagnosis not present

## 2014-05-21 DIAGNOSIS — Z79899 Other long term (current) drug therapy: Secondary | ICD-10-CM | POA: Diagnosis not present

## 2014-05-21 DIAGNOSIS — Z5111 Encounter for antineoplastic chemotherapy: Secondary | ICD-10-CM | POA: Diagnosis not present

## 2014-05-21 DIAGNOSIS — Z7982 Long term (current) use of aspirin: Secondary | ICD-10-CM | POA: Diagnosis not present

## 2014-05-21 DIAGNOSIS — Z510 Encounter for antineoplastic radiation therapy: Secondary | ICD-10-CM | POA: Diagnosis not present

## 2014-05-21 DIAGNOSIS — R5383 Other fatigue: Secondary | ICD-10-CM | POA: Diagnosis not present

## 2014-05-21 DIAGNOSIS — K5900 Constipation, unspecified: Secondary | ICD-10-CM | POA: Diagnosis not present

## 2014-05-21 DIAGNOSIS — C770 Secondary and unspecified malignant neoplasm of lymph nodes of head, face and neck: Secondary | ICD-10-CM | POA: Diagnosis not present

## 2014-05-21 DIAGNOSIS — Z951 Presence of aortocoronary bypass graft: Secondary | ICD-10-CM | POA: Diagnosis not present

## 2014-05-21 DIAGNOSIS — I10 Essential (primary) hypertension: Secondary | ICD-10-CM | POA: Diagnosis not present

## 2014-05-21 DIAGNOSIS — R630 Anorexia: Secondary | ICD-10-CM | POA: Diagnosis not present

## 2014-05-21 DIAGNOSIS — I252 Old myocardial infarction: Secondary | ICD-10-CM | POA: Diagnosis not present

## 2014-05-21 DIAGNOSIS — N289 Disorder of kidney and ureter, unspecified: Secondary | ICD-10-CM | POA: Diagnosis not present

## 2014-05-21 DIAGNOSIS — Z8673 Personal history of transient ischemic attack (TIA), and cerebral infarction without residual deficits: Secondary | ICD-10-CM | POA: Diagnosis not present

## 2014-05-21 DIAGNOSIS — C01 Malignant neoplasm of base of tongue: Secondary | ICD-10-CM | POA: Diagnosis not present

## 2014-05-21 DIAGNOSIS — I2510 Atherosclerotic heart disease of native coronary artery without angina pectoris: Secondary | ICD-10-CM | POA: Diagnosis not present

## 2014-05-21 DIAGNOSIS — R1310 Dysphagia, unspecified: Secondary | ICD-10-CM | POA: Diagnosis not present

## 2014-05-21 DIAGNOSIS — Z794 Long term (current) use of insulin: Secondary | ICD-10-CM | POA: Diagnosis not present

## 2014-05-22 DIAGNOSIS — K5900 Constipation, unspecified: Secondary | ICD-10-CM | POA: Diagnosis not present

## 2014-05-22 DIAGNOSIS — E119 Type 2 diabetes mellitus without complications: Secondary | ICD-10-CM | POA: Diagnosis not present

## 2014-05-22 DIAGNOSIS — R1310 Dysphagia, unspecified: Secondary | ICD-10-CM | POA: Diagnosis not present

## 2014-05-22 DIAGNOSIS — I2510 Atherosclerotic heart disease of native coronary artery without angina pectoris: Secondary | ICD-10-CM | POA: Diagnosis not present

## 2014-05-22 DIAGNOSIS — Z8673 Personal history of transient ischemic attack (TIA), and cerebral infarction without residual deficits: Secondary | ICD-10-CM | POA: Diagnosis not present

## 2014-05-22 DIAGNOSIS — Z794 Long term (current) use of insulin: Secondary | ICD-10-CM | POA: Diagnosis not present

## 2014-05-22 DIAGNOSIS — N289 Disorder of kidney and ureter, unspecified: Secondary | ICD-10-CM | POA: Diagnosis not present

## 2014-05-22 DIAGNOSIS — Z951 Presence of aortocoronary bypass graft: Secondary | ICD-10-CM | POA: Diagnosis not present

## 2014-05-22 DIAGNOSIS — Z79899 Other long term (current) drug therapy: Secondary | ICD-10-CM | POA: Diagnosis not present

## 2014-05-22 DIAGNOSIS — C01 Malignant neoplasm of base of tongue: Secondary | ICD-10-CM | POA: Diagnosis not present

## 2014-05-22 DIAGNOSIS — I10 Essential (primary) hypertension: Secondary | ICD-10-CM | POA: Diagnosis not present

## 2014-05-22 DIAGNOSIS — I252 Old myocardial infarction: Secondary | ICD-10-CM | POA: Diagnosis not present

## 2014-05-22 DIAGNOSIS — Z510 Encounter for antineoplastic radiation therapy: Secondary | ICD-10-CM | POA: Diagnosis not present

## 2014-05-22 DIAGNOSIS — Z7982 Long term (current) use of aspirin: Secondary | ICD-10-CM | POA: Diagnosis not present

## 2014-05-22 DIAGNOSIS — Z5111 Encounter for antineoplastic chemotherapy: Secondary | ICD-10-CM | POA: Diagnosis not present

## 2014-05-22 DIAGNOSIS — C770 Secondary and unspecified malignant neoplasm of lymph nodes of head, face and neck: Secondary | ICD-10-CM | POA: Diagnosis not present

## 2014-05-22 DIAGNOSIS — R5383 Other fatigue: Secondary | ICD-10-CM | POA: Diagnosis not present

## 2014-05-22 DIAGNOSIS — R630 Anorexia: Secondary | ICD-10-CM | POA: Diagnosis not present

## 2014-05-23 DIAGNOSIS — Z510 Encounter for antineoplastic radiation therapy: Secondary | ICD-10-CM | POA: Diagnosis not present

## 2014-05-23 DIAGNOSIS — R630 Anorexia: Secondary | ICD-10-CM | POA: Diagnosis not present

## 2014-05-23 DIAGNOSIS — I10 Essential (primary) hypertension: Secondary | ICD-10-CM | POA: Diagnosis not present

## 2014-05-23 DIAGNOSIS — C770 Secondary and unspecified malignant neoplasm of lymph nodes of head, face and neck: Secondary | ICD-10-CM | POA: Diagnosis not present

## 2014-05-23 DIAGNOSIS — Z951 Presence of aortocoronary bypass graft: Secondary | ICD-10-CM | POA: Diagnosis not present

## 2014-05-23 DIAGNOSIS — R1310 Dysphagia, unspecified: Secondary | ICD-10-CM | POA: Diagnosis not present

## 2014-05-23 DIAGNOSIS — K5900 Constipation, unspecified: Secondary | ICD-10-CM | POA: Diagnosis not present

## 2014-05-23 DIAGNOSIS — Z79899 Other long term (current) drug therapy: Secondary | ICD-10-CM | POA: Diagnosis not present

## 2014-05-23 DIAGNOSIS — I2510 Atherosclerotic heart disease of native coronary artery without angina pectoris: Secondary | ICD-10-CM | POA: Diagnosis not present

## 2014-05-23 DIAGNOSIS — E119 Type 2 diabetes mellitus without complications: Secondary | ICD-10-CM | POA: Diagnosis not present

## 2014-05-23 DIAGNOSIS — N289 Disorder of kidney and ureter, unspecified: Secondary | ICD-10-CM | POA: Diagnosis not present

## 2014-05-23 DIAGNOSIS — I252 Old myocardial infarction: Secondary | ICD-10-CM | POA: Diagnosis not present

## 2014-05-23 DIAGNOSIS — Z7982 Long term (current) use of aspirin: Secondary | ICD-10-CM | POA: Diagnosis not present

## 2014-05-23 DIAGNOSIS — R5383 Other fatigue: Secondary | ICD-10-CM | POA: Diagnosis not present

## 2014-05-23 DIAGNOSIS — Z794 Long term (current) use of insulin: Secondary | ICD-10-CM | POA: Diagnosis not present

## 2014-05-23 DIAGNOSIS — C01 Malignant neoplasm of base of tongue: Secondary | ICD-10-CM | POA: Diagnosis not present

## 2014-05-23 DIAGNOSIS — Z8673 Personal history of transient ischemic attack (TIA), and cerebral infarction without residual deficits: Secondary | ICD-10-CM | POA: Diagnosis not present

## 2014-05-23 DIAGNOSIS — Z5111 Encounter for antineoplastic chemotherapy: Secondary | ICD-10-CM | POA: Diagnosis not present

## 2014-05-24 DIAGNOSIS — Z7982 Long term (current) use of aspirin: Secondary | ICD-10-CM | POA: Diagnosis not present

## 2014-05-24 DIAGNOSIS — C770 Secondary and unspecified malignant neoplasm of lymph nodes of head, face and neck: Secondary | ICD-10-CM | POA: Diagnosis not present

## 2014-05-24 DIAGNOSIS — I252 Old myocardial infarction: Secondary | ICD-10-CM | POA: Diagnosis not present

## 2014-05-24 DIAGNOSIS — K5900 Constipation, unspecified: Secondary | ICD-10-CM | POA: Diagnosis not present

## 2014-05-24 DIAGNOSIS — E119 Type 2 diabetes mellitus without complications: Secondary | ICD-10-CM | POA: Diagnosis not present

## 2014-05-24 DIAGNOSIS — Z5111 Encounter for antineoplastic chemotherapy: Secondary | ICD-10-CM | POA: Diagnosis not present

## 2014-05-24 DIAGNOSIS — R630 Anorexia: Secondary | ICD-10-CM | POA: Diagnosis not present

## 2014-05-24 DIAGNOSIS — I10 Essential (primary) hypertension: Secondary | ICD-10-CM | POA: Diagnosis not present

## 2014-05-24 DIAGNOSIS — Z794 Long term (current) use of insulin: Secondary | ICD-10-CM | POA: Diagnosis not present

## 2014-05-24 DIAGNOSIS — Z510 Encounter for antineoplastic radiation therapy: Secondary | ICD-10-CM | POA: Diagnosis not present

## 2014-05-24 DIAGNOSIS — R1310 Dysphagia, unspecified: Secondary | ICD-10-CM | POA: Diagnosis not present

## 2014-05-24 DIAGNOSIS — Z951 Presence of aortocoronary bypass graft: Secondary | ICD-10-CM | POA: Diagnosis not present

## 2014-05-24 DIAGNOSIS — R5383 Other fatigue: Secondary | ICD-10-CM | POA: Diagnosis not present

## 2014-05-24 DIAGNOSIS — I2510 Atherosclerotic heart disease of native coronary artery without angina pectoris: Secondary | ICD-10-CM | POA: Diagnosis not present

## 2014-05-24 DIAGNOSIS — C01 Malignant neoplasm of base of tongue: Secondary | ICD-10-CM | POA: Diagnosis not present

## 2014-05-24 DIAGNOSIS — Z79899 Other long term (current) drug therapy: Secondary | ICD-10-CM | POA: Diagnosis not present

## 2014-05-24 DIAGNOSIS — N289 Disorder of kidney and ureter, unspecified: Secondary | ICD-10-CM | POA: Diagnosis not present

## 2014-05-24 DIAGNOSIS — Z8673 Personal history of transient ischemic attack (TIA), and cerebral infarction without residual deficits: Secondary | ICD-10-CM | POA: Diagnosis not present

## 2014-05-27 DIAGNOSIS — K5900 Constipation, unspecified: Secondary | ICD-10-CM | POA: Diagnosis not present

## 2014-05-27 DIAGNOSIS — I2510 Atherosclerotic heart disease of native coronary artery without angina pectoris: Secondary | ICD-10-CM | POA: Diagnosis not present

## 2014-05-27 DIAGNOSIS — C770 Secondary and unspecified malignant neoplasm of lymph nodes of head, face and neck: Secondary | ICD-10-CM | POA: Diagnosis not present

## 2014-05-27 DIAGNOSIS — I10 Essential (primary) hypertension: Secondary | ICD-10-CM | POA: Diagnosis not present

## 2014-05-27 DIAGNOSIS — Z79899 Other long term (current) drug therapy: Secondary | ICD-10-CM | POA: Diagnosis not present

## 2014-05-27 DIAGNOSIS — I252 Old myocardial infarction: Secondary | ICD-10-CM | POA: Diagnosis not present

## 2014-05-27 DIAGNOSIS — N289 Disorder of kidney and ureter, unspecified: Secondary | ICD-10-CM | POA: Diagnosis not present

## 2014-05-27 DIAGNOSIS — R630 Anorexia: Secondary | ICD-10-CM | POA: Diagnosis not present

## 2014-05-27 DIAGNOSIS — R1310 Dysphagia, unspecified: Secondary | ICD-10-CM | POA: Diagnosis not present

## 2014-05-27 DIAGNOSIS — E119 Type 2 diabetes mellitus without complications: Secondary | ICD-10-CM | POA: Diagnosis not present

## 2014-05-27 DIAGNOSIS — Z7982 Long term (current) use of aspirin: Secondary | ICD-10-CM | POA: Diagnosis not present

## 2014-05-27 DIAGNOSIS — R5383 Other fatigue: Secondary | ICD-10-CM | POA: Diagnosis not present

## 2014-05-27 DIAGNOSIS — Z5111 Encounter for antineoplastic chemotherapy: Secondary | ICD-10-CM | POA: Diagnosis not present

## 2014-05-27 DIAGNOSIS — Z510 Encounter for antineoplastic radiation therapy: Secondary | ICD-10-CM | POA: Diagnosis not present

## 2014-05-27 DIAGNOSIS — Z951 Presence of aortocoronary bypass graft: Secondary | ICD-10-CM | POA: Diagnosis not present

## 2014-05-27 DIAGNOSIS — Z8673 Personal history of transient ischemic attack (TIA), and cerebral infarction without residual deficits: Secondary | ICD-10-CM | POA: Diagnosis not present

## 2014-05-27 DIAGNOSIS — Z794 Long term (current) use of insulin: Secondary | ICD-10-CM | POA: Diagnosis not present

## 2014-05-27 DIAGNOSIS — C01 Malignant neoplasm of base of tongue: Secondary | ICD-10-CM | POA: Diagnosis not present

## 2014-05-28 NOTE — Consult Note (Signed)
Brief Consult Note: Diagnosis: cad and chest pain. Ruled in for nstemi.   Patient was seen by consultant.   Consult note dictated.   Recommend further assessment or treatment.   Orders entered.   Discussed with Attending MD.   Comments: 74 yo male sp cabg with lima to lad and svg to om and pda and pci who was admitted at [place] in 11/12 with mi. Cath revealed occluded svgs and patent lima. Was treated medically. Now admitted iwth chest pain and has ruled in for a nstemi. Somewhat difficult historian but denis chest pian at present. EKG suggests lbbb inchanged form baseline. Risk and benefits of cardiac cath explained to pat. Proceed iwth cath in am. Further ecs after cath.  Electronic Signatures: Teodoro Spray (MD)  (Signed 23-Dec-13 19:22)  Authored: Brief Consult Note   Last Updated: 23-Dec-13 19:22 by Teodoro Spray (MD)

## 2014-05-28 NOTE — Consult Note (Signed)
General Aspect 74 yo male with history of cad s/p cabg with a lima to the lad, svg to the mid rca and a svg to the d2. Cath in 2008 at [place] revealed a patent lima with occluded svg to d2 and rca. Pt had a pci of lcx which had a 99% lesion. Pt underwent cardiac cath at [facility] in  11/12 with an occluded rca which originated from the lm, distal rca filled by collaterals from lad which was fed by patent lima. The svg to the rca and d2 were agian occluded. The mid circumflex stent was patent. He was treated medically. He now presents with complaints of midsternal burning pain. EKG reveals his chronic lbbb. He has ruled in for a nstemi. He is hemodynamically stable.   Physical Exam:   GEN well developed, well nourished, no acute distress    HEENT PERRL, hearing intact to voice    NECK supple    RESP normal resp effort  clear BS    CARD Regular rate and rhythm  Normal, S1, S2  No murmur    ABD denies tenderness  normal BS  no Adominal Mass    LYMPH negative neck, negative axillae    EXTR negative cyanosis/clubbing, negative edema    SKIN normal to palpation    NEURO cranial nerves intact, motor/sensory function intact    PSYCH poor insight   Review of Systems:   Subjective/Chief Complaint mid sternal chest pain    General: Fatigue    Skin: No Complaints    ENT: No Complaints    Eyes: No Complaints    Neck: No Complaints    Respiratory: No Complaints    Cardiovascular: Chest pain or discomfort    Gastrointestinal: No Complaints    Genitourinary: No Complaints    Musculoskeletal: No Complaints    Hematologic: No Complaints    Endocrine: No Complaints    Psychiatric: No Complaints    Review of Systems: All other systems were reviewed and found to be negative    Medications/Allergies Reviewed Medications/Allergies reviewed     MI - Myocardial Infarct:    Mini stroke:    Diabetes:    HTN:    Insertion Penile implant:    CABG (Coronary Artery Bypass Graft):         Admit Diagnosis:   ACUTE CHEST PAIN: 01-Feb-2012, Active, ACUTE CHEST PAIN      Admit Reason:   Acute chest pain: (786.50) Active, ICD9, Unspecified chest pain   Acute chest pain: (786.50) Active, ICD9, Unspecified chest pain  Home Medications: Medication Instructions Status  benazepril 40 mg oral tablet 1 tab(s) orally once a day Active  furosemide 40 mg oral tablet 1 tab(s) orally once a day Active  ranitidine 300 mg oral tablet 1 tab(s) orally once a day Active  aspirin 81 mg oral tablet 1 tab(s) orally once a day Active  Lantus 100 units/mL subcutaneous solution 34 unit(s) subcutaneous once a day (at bedtime) Active  carvedilol 25 mg oral tablet 1 tab(s) orally 2 times a day Active  clopidogrel 75 mg oral tablet 1 tab(s) orally once a day Active  spironolactone 25 mg oral tablet 0.5 tab(s) orally once a day Active   EKG:   EKG NSR    Abnormal LBBB    EKG Comparision Not changed from    No Known Allergies:     Impression 74 yo male with history of cad s/p cabg with a lima to lad and svg to d2 and  rca which are both chronically occluded. Has a stent in rca which has been occluded and stent in lcx.  Now has nstemi and ekg shows his chronic lbbb. Stable clinicaly. Will need evaluation for progression of his cad. Risk and benefit of left cardiac cath explained.    Plan 1. Conitnue current meds including beta blockers, asa, nitrates, heparin 2. Proceed with left cardiac cath to evaluate anatomy 3. Further recs after cath.   Electronic Signatures: Teodoro Spray (MD)  (Signed 24-Dec-13 08:28)  Authored: General Aspect/Present Illness, History and Physical Exam, Review of System, Past Medical History, Health Issues, Home Medications, EKG , Allergies, Impression/Plan   Last Updated: 24-Dec-13 08:28 by Teodoro Spray (MD)

## 2014-05-29 DIAGNOSIS — C01 Malignant neoplasm of base of tongue: Secondary | ICD-10-CM | POA: Diagnosis not present

## 2014-05-29 DIAGNOSIS — C770 Secondary and unspecified malignant neoplasm of lymph nodes of head, face and neck: Secondary | ICD-10-CM | POA: Diagnosis not present

## 2014-05-31 NOTE — Discharge Summary (Signed)
PATIENT NAME:  Ricky Rangel, SHORB MR#:  [id number] DATE OF BIRTH:  02/06/1941  DATE OF ADMISSION:  01/31/2012 DATE OF DISCHARGE:  02/02/2012  PRIMARY CARE PHYSICIAN: Otilio Miu, MD  DISCHARGE DIAGNOSES: 1. Non-ST-elevation myocardial infarction, status post stent. 2. Diabetes mellitus. 3. Hypertension.   HISTORY OF PRESENT ILLNESS: The patient is a 74 year old male who has a history of coronary artery disease who says on the morning of presentation he started having some chest discomfort. He thought that it was heartburn. He took Zantac but did not relieve. He had a heart catheterization at [facility] 3 to 4 weeks ago which he says was reported as wide open, but he felt like he needed to come to the hospital with these symptoms as they would not go away and that is why he came. He was noticed having slight increase in his troponin and so he was initially admitted for observation.   HOSPITAL COURSE AND STAY: His second troponin, which was followed, came up to 3.8 and so he was started on heparin drip and cardiology consult was called in. He was scheduled for cardiac catheterization the next morning. Cardiac catheterization was done the next day. The report is as following. Impression: There is significant triple vessel coronary artery disease. It appears that the patient would require coronary revascularization in the near future. Left ventricular function is abnormal. Summary: Successful PCI stent to mid circumflex, 90% to 0%. Done by Dr. Clayborn Bigness. So, he was observed overnight after the cardiac catheterization with aspirin and Plavix and the next day he was feeling fine so discharged home.   Other medical issues addressed during the hospital stay: 1. History of coronary artery disease. As he was having acute MI during this admission, he was maintained on cardiac medications and discharged with that.  2. Diabetes mellitus. He was maintained on Lantus plus insulin sliding scale coverage.  3. Hypertension. He  was continued on home medications and blood pressure remained stable.  IMPORTANT LAB RESULTS: Troponin on admission 0.83. On admission BMP was glucose 215, BUN 25, creatinine 1.13, sodium 140, potassium 4.4, CO2 22, chloride 109, alkaline phosphatase 80, SGPT 31, SGOT 38, total protein 7.2, albumin 3.7, and bilirubin 0.6. Second troponin 3.97. Third troponin 4.28.  Cardiac catheterization report, as mentioned above.  CONSULTANT: Bartholome Dartagnan, MD - Cardiology.  CONDITION ON DISCHARGE: Stable.   CODE STATUS: FULL CODE.   MEDICATIONS ADVISED ON DISCHARGE:  1. Benazepril 40 mg oral tablet once a day. 2. Furosemide 40 mg once a day. 3. Ranitidine 300 mg oral tablet once a day. 4. Lantus 34 units subcutaneous once a day at bedtime.  5. Carvedilol 25 mg 2 times a day.  6. Clopidogrel 75 mg oral tablet once a day. 7. Spironolactone 25 mg oral tablet once a day.  8. Aspirin 325 mg oral delayed-release tablet once a day. 9. Simvastatin 20 mg once a day.   HOME OXYGEN: None.   HOME HEALTH: None.   DIET ON DISCHARGE: Low sodium, low fat, low cholesterol, carbohydrate-controlled ADA diet, consistency regular.   ACTIVITY LIMITATION: No exertional activity. No heavy lifting for 10 days. Return to work after followup with MD.     Britt Bottom TO FOLLOW-UP: Follow up in 1 to 2 weeks. Advised to follow up with Dr. Nehemiah Massed in the office next week.  TOTAL TIME SPENT ON DISCHARGE: 45 minutes. ____________________________ Ceasar Lund Anselm Jungling, MD vgv:sb D: 02/06/2012 14:40:31 ET T: 02/07/2012 07:31:27 ET JOB#: [id number]  cc: Ceasar Lund. Anselm Jungling, MD, <Dictator> Deanna  Ranell Patrick, MD Corey Skains, MD Rosalio Macadamia [facility] MD ELECTRONICALLY SIGNED 03/06/2012 8:19

## 2014-05-31 NOTE — Op Note (Signed)
PATIENT NAME:  Ricky Rangel, Ricky Rangel MR#:  [id number] DATE OF BIRTH:  01-09-1941  DATE OF PROCEDURE AND DATE OF DICTATION:  01/03/2013  PREOPERATIVE DIAGNOSES:  1. High-grade left carotid artery stenosis.  2. Coronary artery disease.  3. Status post right carotid endarterectomy.  4. Peripheral arterial disease.  5. Diabetes mellitus.   POSTOPERATIVE DIAGNOSES:  1. High-grade left carotid artery stenosis.  2. Coronary artery disease.  3. Status post right carotid endarterectomy.  4. Peripheral arterial disease.  5. Diabetes mellitus.   PROCEDURE: Left carotid endarterectomy with CorMatrix arterial reconstruction.   SURGEON: Algernon Huxley, MD  ANESTHESIA: General.   ESTIMATED BLOOD LOSS: 100 mL.   INDICATION FOR PROCEDURE: A 74 year old white male with high-grade left carotid artery stenosis. For stroke risk reduction, surgery was offered, and he desired to proceed. He has had previous strokes and has residual deficits of balance and cognitive issues, although he still remains reasonably functional. His strokes have been remote and over 3 months removed.   DESCRIPTION OF THE PROCEDURE: The patient is brought to the operative suite, and after an adequate level of general anesthesia was obtained, he was placed in modified beach chair position. A roll was placed under his shoulder. His neck was flexed and turned to the right. The neck and chest were sterilely prepped and draped, and a sterile surgical field was created. An incision was created along the anterior border of the sternocleidomastoid. We dissected down through the platysma with electrocautery. The Weitlaner retractor was used to help facilitate our exposure. There were a lot of venous collaterals and not a dominant facial vein. Multiple veins were ligated and divided between silk ties, and this exposed the carotid artery. The carotid artery was densely diseased and calcified, and a long lesion was seen from well into the common carotid  artery up well into the internal carotid artery. The artery was dissected out proximal and distal to the disease. The common carotid artery, external carotid artery, superior thyroid artery and internal carotid artery were all encircled with Vesseloops. The patient was systemically heparinized. The artery was then opened. The Pruitt-Inahara shunt was then placed, first in the internal carotid artery, flushed and de-aired, and then in the common carotid artery. An endarterectomy was then performed in the usual fashion with the proximal endpoint cut flush with tenotomy scissors. The external carotid artery had an eversion endarterectomy performed. The internal carotid artery endpoint was created with gentle traction. All loose flecks were removed, and the vessel was locally heparinized. This was a long lesion. A CorMatrix arterial patch was used to reconstruct the artery. This was cut and beveled distally, and a 6-0 Prolene was started at the distal endpoint after three 7-0 Prolene tacking sutures were used to tack down the distal endpoint. We ran approximately one-half the length of the arteriotomy, and then cut and beveled the patch to match the length of the arteriotomy and started a second 6-0 Prolene at the proximal endpoint. The medial suture line was run together and tied. The lateral suture line was run approximately one-quarter the length of the arteriotomy. The Pruitt-Inahara shunt was then removed. The vessel was flushed through the external, internal and common carotid arteries, and then the suture line was completed, flushing through the external carotid artery for several cardiac cycles. A couple of 6-0 Prolene patch sutures were used for hemostasis, and hemostasis was achieved. The wound was irrigated. Surgicel and Evicel topical hemostatic agents were placed. The wound was then closed with 3  interrupted 3-0 Vicryl suture in the sternocleidomastoid space. The platysma was closed with a running 3-0  Vicryl, and the skin was closed with a 4-0 Monocryl. Dermabond was placed as a dressing. The patient tolerated the procedure well and was taken to the recovery room in stable condition.   ____________________________ Algernon Huxley, MD jsd:lb D: 01/03/2013 11:39:35 ET T: 01/03/2013 12:53:43 ET JOB#: [id number]  cc: Algernon Huxley, MD, <Dictator> Juline Patch, MD Algernon Huxley MD ELECTRONICALLY SIGNED 01/15/2013 9:23

## 2014-05-31 NOTE — Consult Note (Signed)
PATIENT NAME:  Ricky Rangel, Ricky Rangel MR#:  [id number] DATE OF BIRTH:  [birth year]  CARDIOLOGY CONSULTATION   DATE OF CONSULTATION:  01/03/2013  REFERRING PHYSICIAN:  Algernon Huxley, MD CONSULTING PHYSICIAN:  Isaias Cowman, MD  PRIMARY CARE PHYSICIAN: Juline Patch, MD  CHIEF COMPLAINT: Ventricular tachycardia.   HISTORY OF PRESENT ILLNESS: The patient is a 74 year old gentleman with known coronary artery disease, ischemic cardiomyopathy, who underwent left carotid endarterectomy earlier today. The patient was admitted to the CCU, where he developed tachycardia, with telemetry revealing polymorphic ventricular tachycardia, which spontaneously resolved. The patient denies chest pain or shortness of breath. Labs are pending. Postevent EKG reveals sinus bradycardia with evidence of old anterior infarct with lateral T wave inversions, which appears unchanged compared to prior EKG.   PAST MEDICAL HISTORY:  1. Status post coronary artery bypass graft surgery, January 2001. 2. Status post coronary stent, proximal left circumflex and left main, May 2005.  3. Status post non-ST-elevation myocardial infarction and coronary stent, mid left circumflex, June 2008. 4. Status post non-ST-elevation myocardial infarction and coronary stent, left circumflex, December 2013.  5. Hyperlipidemia.  6. Hypertension.  7. Diabetes.  8. Peripheral vascular disease, status post right carotid endarterectomy. 9. Ischemic cardiomyopathy. 10. Chronic systolic congestive heart failure.   MEDICATIONS:  1. Aspirin 81 mg daily.  2. Clopidogrel 75 mg daily.  3. Carvedilol 25 mg daily.  4. Benazepril 40 mg daily.  5. Spironolactone 12.5 mg daily.  6. Atorvastatin 40 mg daily. 7. Lantus insulin 30 units subcutaneous at bedtime.  8. Zantac 300 mg daily.   SOCIAL HISTORY: The patient is married and resides with his wife. He denies tobacco abuse.   FAMILY HISTORY: No immediate family history of coronary artery disease or  myocardial infarction.   REVIEW OF SYSTEMS:  CONSTITUTIONAL: No fever or chills.  EYES: No blurry vision.  EARS: No hearing loss.  RESPIRATORY: No shortness of breath.  CARDIOVASCULAR: No chest pain.  GASTROINTESTINAL: The patient does have nausea with vomiting following surgery,  GENITOURINARY: No dysuria or hematuria.  ENDOCRINE: No polyuria or polydipsia.  MUSCULOSKELETAL: No arthralgias or myalgias.  NEUROLOGICAL: No focal muscle weakness or numbness.  PSYCHOLOGICAL: No depression or anxiety.   PHYSICAL EXAMINATION:  VITAL SIGNS: Blood pressure 92/46, pulse 68.  HEENT: Pupils equally reactive to light and accommodation.  NECK: Supple without thyromegaly.  LUNGS: Clear.  CARDIOVASCULAR: Normal JVP. Diffuse PMI. Regular rate and rhythm. Normal S1, S2. No appreciable gallop, murmur or rub.  ABDOMEN: Soft and nontender.  PULSES: Intact bilaterally.  MUSCULOSKELETAL: Normal muscle tone.  NEUROLOGICAL: The patient is alert and oriented x3. Motor and sensory both grossly intact.   IMPRESSION: A 74 year old gentleman with known coronary artery disease, status post bypass graft surgery and multiple coronary stents, with ischemic cardiomyopathy and history of chronic systolic congestive heart failure. He underwent left carotid endarterectomy and developed polymorphic ventricular tachycardia, which resolved spontaneously.   RECOMMENDATIONS:  1. Check electrolytes.  2. Magnesium 1 gram IV. 3. Amiodarone bolus and drip. 4. Check cardiac biomarkers.  5. No further cardiac diagnostics at this time. 6. Defer full-dose anticoagulation.   ____________________________ Isaias Cowman, MD ap:lb D: 01/03/2013 13:52:48 ET T: 01/03/2013 14:40:58 ET JOB#: [id number]  cc: Isaias Cowman, MD, <Dictator> Isaias Cowman MD ELECTRONICALLY SIGNED 01/26/2013 8:51

## 2014-05-31 NOTE — Discharge Summary (Signed)
PATIENT NAME:  Ricky Rangel, Ricky Rangel MR#:  [id number] DATE OF BIRTH:  [birth year]  DATE OF ADMISSION:  01/03/2013 DATE OF DISCHARGE:  01/05/2013  DISCHARGE DIAGNOSIS: Critical stenosis of the left internal carotid artery.   SECONDARY DIAGNOSES: Cardiac arrhythmia with an episode of ventricular tachycardia.   PROCEDURES PERFORMED: Left carotid endarterectomy with CorMatrix patch angioplasty by Ricky Rangel, 01/03/2013.   CONSULTATIONS: Ricky Rangel, cardiology.   HISTORY OF PRESENT ILLNESS: Ricky Rangel is a 74 year old gentleman who has been followed in the office and has progressed on the left side. He now has a critical stenosis. He has undergone appropriate preoperative work-up and is presenting for a left carotid endarterectomy.   HOSPITAL COURSE: On the day of admission, he underwent successful left carotid endarterectomy. CorMatrix patch was used to repair the arterial defect. Shortly after the surgery the patient experienced a single episode of ventricular tachycardia. He was promptly treated, subsequently converted, and was initiated on amiodarone intravenously. He has been completely stable from his carotid surgery point of view. Neck is clean, dry, and intact. No evidence of significant hematoma, and he has been neurologically intact the entire time.   Given his cardiac arrhythmia, it was elected to continue his amiodarone intravenously. Over the first postoperative day, on hospital day #2, he has been converted to p.o. He has been seen by cardiology and cleared to go home. From a surgical point of view, he has done quite well and is also cleared for going home.   He is discharged to home. His diet is low carbohydrate ADA diet. Activities are as tolerated, but no heavy lifting or exertion. Medications are the same as preop with the addition of amiodarone 400 mg p.o. daily. He may shower in 24 hours. He will follow up in the office within the week with Ricky Rangel. He will follow up with Ricky Rangel  within the week as well.   CONDITION ON DISCHARGE: Stable and improved.  ____________________________ Ricky Cabal, MD ggs:sb D: 01/05/2013 12:49:04 ET T: 01/05/2013 13:26:50 ET JOB#: [id number]  cc: Ricky Cabal, MD, <Dictator> Ricky Cabal MD ELECTRONICALLY SIGNED 01/15/2013 17:20

## 2014-05-31 NOTE — H&P (Signed)
PATIENT NAME:  Ricky Rangel, WEIRAUCH MR#:  [id number] DATE OF BIRTH:  06/16/1940  DATE OF ADMISSION:  01/31/2012  PRIMARY CARE PHYSICIAN: Juline Patch, MD  CARDIOLOGIST: Corey Skains, MD  CHIEF COMPLAINT: Chest pain.   HISTORY OF PRESENT ILLNESS: This is a 74 year old male who has a history of coronary artery disease. He says this morning, he started having some chest discomfort that he thought was heartburn. He took Zantac, this did not relieve it. The pain is gone now. He said he just had a heart cath at [facility] about 3 to 4 weeks ago which he said was reported as wide open, but he felt like he needed to come to the hospital with these symptoms because they would not go away. Here, he was found to have a slight increase in his troponin, so we are going to admit him for observation.   PAST MEDICAL HISTORY: Coronary artery disease, diabetes mellitus, hypertension, GERD.  PAST SURGICAL HISTORY: A penile implant.  ALLERGIES: No known drug allergies.   CURRENT MEDICATIONS: Aspirin 81 mg daily, benazepril 40 mg daily, carvedilol 25 mg b.i.d., Plavix 75 mg daily, Lasix 40 mg daily, Lantus 34 units at bedtime, ranitidine 300 mg daily, spironolactone 25 mg daily.   SOCIAL HISTORY: Does not smoke, does not drink alcohol.   FAMILY HISTORY: Significant for coronary artery disease.   REVIEW OF SYSTEMS: CONSTITUTIONAL: No fever or chills.  EYES: No blurred vision.  ENT: No hearing loss.  CARDIOVASCULAR: Some chest pain.  PULMONARY: No shortness of breath.  GASTROINTESTINAL: He has had some heartburn.  GENITOURINARY: No dysuria.  ENDOCRINE: No heat or cold intolerance.  INTEGUMENT: No rash.  MUSCULOSKELETAL: Occasional joint pain.  NEUROLOGIC: No numbness or weakness.   PHYSICAL EXAMINATION:  VITAL SIGNS: Temperature is 98, pulse 71, respirations 20, blood pressure 145/67.  GENERAL: This is a well-nourished white male with no acute distress.  HEENT: Pupils are equal, round, and reactive to  light. Sclerae are anicteric. Oral mucosa is moist. Oropharynx is clear. Nasopharynx is clear.  NECK: Supple. No JVD, lymphadenopathy, no thyromegaly.  CARDIOVASCULAR: Regular rate and rhythm. No murmurs, rubs, or gallops.  LUNGS: Clear to auscultation. No dullness to percussion. He is not using accessory muscles.  ABDOMEN: Soft, nontender, nondistended. Bowel sounds are positive. No hepatosplenomegaly. No masses.  EXTREMITIES: No edema. No joint deformity.  NEUROLOGIC: Cranial nerves II through XII are intact. He is alert and oriented x 4.  SKIN: Moist with no rash.   DIAGNOSTIC DATA: EKG shows normal sinus rhythm with a left bundle branch block. Troponin is 0.83.   ASSESSMENT AND PLAN:  1. Chest pain. The patient does have history of coronary artery disease. He does have a mildly elevated troponin, so will go ahead and admit him for observation to see if this is trending up or down. Will try to get cath report from [facility]. If he has had open a stents and his troponin trends down, they can probably send him home with outpatient followup. I will go ahead and consult Cardiology.  2. Diabetes. Will continue his current medications.  3. Coronary artery disease. Will go ahead and continue medications. I will go ahead and increase his aspirin to 325 with acute symptoms.  4. Hypertension. Continue current medications.  Time spent on admission was 35 minutes.    ____________________________ Baxter Hire, MD jdj:es D: 01/31/2012 10:25:14 ET T: 01/31/2012 10:47:36 ET JOB#: [id number]  cc: Baxter Hire, MD, <Dictator> Juline Patch, MD Darnell Level  Kelli Hope, MD Baxter Hire MD ELECTRONICALLY SIGNED 02/09/2012 11:27

## 2014-06-03 DIAGNOSIS — Z794 Long term (current) use of insulin: Secondary | ICD-10-CM | POA: Diagnosis not present

## 2014-06-03 DIAGNOSIS — C770 Secondary and unspecified malignant neoplasm of lymph nodes of head, face and neck: Secondary | ICD-10-CM | POA: Diagnosis not present

## 2014-06-03 DIAGNOSIS — Z510 Encounter for antineoplastic radiation therapy: Secondary | ICD-10-CM | POA: Diagnosis not present

## 2014-06-03 DIAGNOSIS — Z79899 Other long term (current) drug therapy: Secondary | ICD-10-CM | POA: Diagnosis not present

## 2014-06-03 DIAGNOSIS — I252 Old myocardial infarction: Secondary | ICD-10-CM | POA: Diagnosis not present

## 2014-06-03 DIAGNOSIS — I2510 Atherosclerotic heart disease of native coronary artery without angina pectoris: Secondary | ICD-10-CM | POA: Diagnosis not present

## 2014-06-03 DIAGNOSIS — Z951 Presence of aortocoronary bypass graft: Secondary | ICD-10-CM | POA: Diagnosis not present

## 2014-06-03 DIAGNOSIS — R630 Anorexia: Secondary | ICD-10-CM | POA: Diagnosis not present

## 2014-06-03 DIAGNOSIS — R5383 Other fatigue: Secondary | ICD-10-CM | POA: Diagnosis not present

## 2014-06-03 DIAGNOSIS — Z7982 Long term (current) use of aspirin: Secondary | ICD-10-CM | POA: Diagnosis not present

## 2014-06-03 DIAGNOSIS — I10 Essential (primary) hypertension: Secondary | ICD-10-CM | POA: Diagnosis not present

## 2014-06-03 DIAGNOSIS — Z8673 Personal history of transient ischemic attack (TIA), and cerebral infarction without residual deficits: Secondary | ICD-10-CM | POA: Diagnosis not present

## 2014-06-03 DIAGNOSIS — C01 Malignant neoplasm of base of tongue: Secondary | ICD-10-CM | POA: Diagnosis not present

## 2014-06-03 DIAGNOSIS — E119 Type 2 diabetes mellitus without complications: Secondary | ICD-10-CM | POA: Diagnosis not present

## 2014-06-03 DIAGNOSIS — K5900 Constipation, unspecified: Secondary | ICD-10-CM | POA: Diagnosis not present

## 2014-06-03 DIAGNOSIS — R1310 Dysphagia, unspecified: Secondary | ICD-10-CM | POA: Diagnosis not present

## 2014-06-03 DIAGNOSIS — N289 Disorder of kidney and ureter, unspecified: Secondary | ICD-10-CM | POA: Diagnosis not present

## 2014-06-03 DIAGNOSIS — Z5111 Encounter for antineoplastic chemotherapy: Secondary | ICD-10-CM | POA: Diagnosis not present

## 2014-06-04 ENCOUNTER — Other Ambulatory Visit: Payer: Self-pay | Admitting: Oncology

## 2014-06-04 DIAGNOSIS — C770 Secondary and unspecified malignant neoplasm of lymph nodes of head, face and neck: Secondary | ICD-10-CM | POA: Diagnosis not present

## 2014-06-04 DIAGNOSIS — R1310 Dysphagia, unspecified: Secondary | ICD-10-CM | POA: Diagnosis not present

## 2014-06-04 DIAGNOSIS — C01 Malignant neoplasm of base of tongue: Secondary | ICD-10-CM | POA: Diagnosis not present

## 2014-06-04 DIAGNOSIS — Z5111 Encounter for antineoplastic chemotherapy: Secondary | ICD-10-CM | POA: Diagnosis not present

## 2014-06-04 DIAGNOSIS — I2510 Atherosclerotic heart disease of native coronary artery without angina pectoris: Secondary | ICD-10-CM | POA: Diagnosis not present

## 2014-06-04 DIAGNOSIS — Z79899 Other long term (current) drug therapy: Secondary | ICD-10-CM | POA: Diagnosis not present

## 2014-06-04 DIAGNOSIS — Z794 Long term (current) use of insulin: Secondary | ICD-10-CM | POA: Diagnosis not present

## 2014-06-04 DIAGNOSIS — Z7982 Long term (current) use of aspirin: Secondary | ICD-10-CM | POA: Diagnosis not present

## 2014-06-04 DIAGNOSIS — R5383 Other fatigue: Secondary | ICD-10-CM | POA: Diagnosis not present

## 2014-06-04 DIAGNOSIS — I252 Old myocardial infarction: Secondary | ICD-10-CM | POA: Diagnosis not present

## 2014-06-04 DIAGNOSIS — K5900 Constipation, unspecified: Secondary | ICD-10-CM | POA: Diagnosis not present

## 2014-06-04 DIAGNOSIS — Z510 Encounter for antineoplastic radiation therapy: Secondary | ICD-10-CM | POA: Diagnosis not present

## 2014-06-04 DIAGNOSIS — N289 Disorder of kidney and ureter, unspecified: Secondary | ICD-10-CM | POA: Diagnosis not present

## 2014-06-04 DIAGNOSIS — R630 Anorexia: Secondary | ICD-10-CM | POA: Diagnosis not present

## 2014-06-04 DIAGNOSIS — Z951 Presence of aortocoronary bypass graft: Secondary | ICD-10-CM | POA: Diagnosis not present

## 2014-06-04 DIAGNOSIS — Z8673 Personal history of transient ischemic attack (TIA), and cerebral infarction without residual deficits: Secondary | ICD-10-CM | POA: Diagnosis not present

## 2014-06-04 DIAGNOSIS — C760 Malignant neoplasm of head, face and neck: Secondary | ICD-10-CM

## 2014-06-04 DIAGNOSIS — E119 Type 2 diabetes mellitus without complications: Secondary | ICD-10-CM | POA: Diagnosis not present

## 2014-06-04 DIAGNOSIS — I10 Essential (primary) hypertension: Secondary | ICD-10-CM | POA: Diagnosis not present

## 2014-06-04 LAB — CBC CANCER CENTER
BASOS PCT: 0.7 %
Basophil #: 0 x10 3/mm (ref 0.0–0.1)
Eosinophil #: 0.3 x10 3/mm (ref 0.0–0.7)
Eosinophil %: 6 %
HCT: 40.3 % (ref 40.0–52.0)
HGB: 13.7 g/dL (ref 13.0–18.0)
LYMPHS ABS: 1 x10 3/mm (ref 1.0–3.6)
LYMPHS PCT: 22.5 %
MCH: 31.3 pg (ref 26.0–34.0)
MCHC: 34 g/dL (ref 32.0–36.0)
MCV: 92 fL (ref 80–100)
MONO ABS: 0.5 x10 3/mm (ref 0.2–1.0)
Monocyte %: 11.3 %
Neutrophil #: 2.6 x10 3/mm (ref 1.4–6.5)
Neutrophil %: 59.5 %
PLATELETS: 148 x10 3/mm — AB (ref 150–440)
RBC: 4.38 10*6/uL — ABNORMAL LOW (ref 4.40–5.90)
RDW: 17.2 % — ABNORMAL HIGH (ref 11.5–14.5)
WBC: 4.3 x10 3/mm (ref 3.8–10.6)

## 2014-06-04 LAB — BASIC METABOLIC PANEL
Anion Gap: 6 — ABNORMAL LOW (ref 7–16)
BUN: 28 mg/dL — ABNORMAL HIGH
CO2: 27 mmol/L
Calcium, Total: 9.2 mg/dL
Chloride: 104 mmol/L
Creatinine: 1.13 mg/dL
EGFR (African American): >60
GLUCOSE: 157 mg/dL — AB
Potassium: 4 mmol/L
SODIUM: 137 mmol/L

## 2014-06-05 DIAGNOSIS — C770 Secondary and unspecified malignant neoplasm of lymph nodes of head, face and neck: Secondary | ICD-10-CM | POA: Diagnosis not present

## 2014-06-06 DIAGNOSIS — N289 Disorder of kidney and ureter, unspecified: Secondary | ICD-10-CM | POA: Diagnosis not present

## 2014-06-06 DIAGNOSIS — Z510 Encounter for antineoplastic radiation therapy: Secondary | ICD-10-CM | POA: Diagnosis not present

## 2014-06-06 DIAGNOSIS — Z5111 Encounter for antineoplastic chemotherapy: Secondary | ICD-10-CM | POA: Diagnosis not present

## 2014-06-06 DIAGNOSIS — I2510 Atherosclerotic heart disease of native coronary artery without angina pectoris: Secondary | ICD-10-CM | POA: Diagnosis not present

## 2014-06-06 DIAGNOSIS — Z7982 Long term (current) use of aspirin: Secondary | ICD-10-CM | POA: Diagnosis not present

## 2014-06-06 DIAGNOSIS — E119 Type 2 diabetes mellitus without complications: Secondary | ICD-10-CM | POA: Diagnosis not present

## 2014-06-06 DIAGNOSIS — Z951 Presence of aortocoronary bypass graft: Secondary | ICD-10-CM | POA: Diagnosis not present

## 2014-06-06 DIAGNOSIS — C770 Secondary and unspecified malignant neoplasm of lymph nodes of head, face and neck: Secondary | ICD-10-CM | POA: Diagnosis not present

## 2014-06-06 DIAGNOSIS — R630 Anorexia: Secondary | ICD-10-CM | POA: Diagnosis not present

## 2014-06-06 DIAGNOSIS — C01 Malignant neoplasm of base of tongue: Secondary | ICD-10-CM | POA: Diagnosis not present

## 2014-06-06 DIAGNOSIS — Z794 Long term (current) use of insulin: Secondary | ICD-10-CM | POA: Diagnosis not present

## 2014-06-06 DIAGNOSIS — Z8673 Personal history of transient ischemic attack (TIA), and cerebral infarction without residual deficits: Secondary | ICD-10-CM | POA: Diagnosis not present

## 2014-06-06 DIAGNOSIS — K5900 Constipation, unspecified: Secondary | ICD-10-CM | POA: Diagnosis not present

## 2014-06-06 DIAGNOSIS — I252 Old myocardial infarction: Secondary | ICD-10-CM | POA: Diagnosis not present

## 2014-06-06 DIAGNOSIS — R5383 Other fatigue: Secondary | ICD-10-CM | POA: Diagnosis not present

## 2014-06-06 DIAGNOSIS — Z79899 Other long term (current) drug therapy: Secondary | ICD-10-CM | POA: Diagnosis not present

## 2014-06-06 DIAGNOSIS — R1310 Dysphagia, unspecified: Secondary | ICD-10-CM | POA: Diagnosis not present

## 2014-06-06 DIAGNOSIS — I10 Essential (primary) hypertension: Secondary | ICD-10-CM | POA: Diagnosis not present

## 2014-06-07 ENCOUNTER — Ambulatory Visit: Payer: Medicare Other

## 2014-06-07 DIAGNOSIS — R630 Anorexia: Secondary | ICD-10-CM | POA: Diagnosis not present

## 2014-06-07 DIAGNOSIS — Z510 Encounter for antineoplastic radiation therapy: Secondary | ICD-10-CM | POA: Diagnosis not present

## 2014-06-07 DIAGNOSIS — Z5111 Encounter for antineoplastic chemotherapy: Secondary | ICD-10-CM | POA: Diagnosis not present

## 2014-06-07 DIAGNOSIS — C01 Malignant neoplasm of base of tongue: Secondary | ICD-10-CM | POA: Diagnosis not present

## 2014-06-07 DIAGNOSIS — Z8673 Personal history of transient ischemic attack (TIA), and cerebral infarction without residual deficits: Secondary | ICD-10-CM | POA: Diagnosis not present

## 2014-06-07 DIAGNOSIS — I2510 Atherosclerotic heart disease of native coronary artery without angina pectoris: Secondary | ICD-10-CM | POA: Diagnosis not present

## 2014-06-07 DIAGNOSIS — R1310 Dysphagia, unspecified: Secondary | ICD-10-CM | POA: Diagnosis not present

## 2014-06-07 DIAGNOSIS — R5383 Other fatigue: Secondary | ICD-10-CM | POA: Diagnosis not present

## 2014-06-07 DIAGNOSIS — Z951 Presence of aortocoronary bypass graft: Secondary | ICD-10-CM | POA: Diagnosis not present

## 2014-06-07 DIAGNOSIS — Z79899 Other long term (current) drug therapy: Secondary | ICD-10-CM | POA: Diagnosis not present

## 2014-06-07 DIAGNOSIS — E119 Type 2 diabetes mellitus without complications: Secondary | ICD-10-CM | POA: Diagnosis not present

## 2014-06-07 DIAGNOSIS — Z794 Long term (current) use of insulin: Secondary | ICD-10-CM | POA: Diagnosis not present

## 2014-06-07 DIAGNOSIS — Z7982 Long term (current) use of aspirin: Secondary | ICD-10-CM | POA: Diagnosis not present

## 2014-06-07 DIAGNOSIS — N289 Disorder of kidney and ureter, unspecified: Secondary | ICD-10-CM | POA: Diagnosis not present

## 2014-06-07 DIAGNOSIS — I10 Essential (primary) hypertension: Secondary | ICD-10-CM | POA: Diagnosis not present

## 2014-06-07 DIAGNOSIS — I252 Old myocardial infarction: Secondary | ICD-10-CM | POA: Diagnosis not present

## 2014-06-07 DIAGNOSIS — K5900 Constipation, unspecified: Secondary | ICD-10-CM | POA: Diagnosis not present

## 2014-06-07 DIAGNOSIS — C770 Secondary and unspecified malignant neoplasm of lymph nodes of head, face and neck: Secondary | ICD-10-CM | POA: Diagnosis not present

## 2014-06-08 ENCOUNTER — Ambulatory Visit: Admission: RE | Admit: 2014-06-08 | Payer: Medicare Other | Source: Ambulatory Visit

## 2014-06-09 NOTE — Consult Note (Signed)
Reason for Visit: This 74 year old Male patient presents to the clinic for initial evaluation of  head and neck cancer .   Referred by Dr.Juengle.  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 Underwent a fine-needle aspiration by ENT which was positive for squamous cell carcinoma. CT scan of the neck showed a 3.8 x 2.6 cm necrotic right level III node with some additional level III nodes all of which were hypermetabolic on PET CT scan. Patient also had a mass in the base of tongue which is hypermetabolic consistent with base of tongue primary squamous cell carcinoma with neck metastasis. He is really having no head and neck symptoms no dysphasia head and neck pain. On examination by ENT had swelling of the right tongue base about 1.5 cm on PET CT scan lesion is over 2 cm in greatest dimension. He is now referred to radiation oncology and medical oncology for consideration of combined modality treatment.he has a past medical history sniffing for adult-onset diabetes , MI and hypertension as well as coronary artery bypass graft.  Past Hx:    MI - Myocardial Infarct:    Mini stroke:    Diabetes:    HTN:    Insertion Penile implant:    CABG (Coronary Artery Bypass Graft):   Past, Family and Social History:  Past Medical History positive   Cardiovascular CABG performed; coronary artery disease; hyperlipidemia; hypertension   Genitourinary insertion of penile prosthesis   Endocrine diabetes mellitus   Neurological/Psychiatric  depression; TIA   Family History noncontributory   Social History noncontributory   Additional Past Medical and Surgical History seen by himself today   Allergies:   No Known Allergies:   Home Meds:  Home Medications: Medication Instructions Status  ranitidine 300 mg oral tablet 1 tab(s) orally once a day Active  losartan 50 mg oral tablet 1 tab(s) orally once a day Active  clopidogrel 75 mg oral tablet 1 tab(s) orally once a day Active  spironolactone 25 mg oral tablet 0.5 tab(s) orally once a day Active  aspirin 81 mg oral tablet 1 tab(s) orally once a day Active  atorvastatin 40 mg oral tablet 1 tab(s) orally once a day (at bedtime) Active  Lantus 100 units/mL subcutaneous solution 20 unit(s) subcutaneous once a day (at bedtime) Active   Review of Systems:  General negative   Performance Status (ECOG) 0   Skin negative   Breast negative   ENMT see HPI   Respiratory and Thorax negative   Cardiovascular see HPI   Gastrointestinal negative   Genitourinary negative   Musculoskeletal negative   Neurological negative   Psychiatric negative   Hematology/Lymphatics negative   Endocrine negative   Allergic/Immunologic negative   Review of Systems   denies any weight loss, fatigue, weakness, fever, chills or night sweats. Patient denies any loss of vision, blurred vision. Patient denies any ringing  of the ears or hearing loss. No irregular heartbeat. Patient denies heart murmur or history of fainting. Patient denies any chest pain or pain radiating to her upper extremities. Patient denies any  shortness of breath, difficulty breathing at night, cough or hemoptysis. Patient denies any swelling in the lower legs. Patient denies any nausea vomiting, vomiting of blood, or coffee ground material in the vomitus. Patient denies any stomach pain. Patient states has had normal bowel movements no significant constipation or diarrhea. Patient denies any dysuria, hematuria or  significant nocturia. Patient denies any problems walking, swelling in the joints or loss of balance. Patient denies any skin changes, loss of hair or loss of weight. Patient denies any excessive worrying or anxiety or significant depression. Patient denies any problems with insomnia. Patient denies excessive thirst, polyuria, polydipsia. Patient denies any swollen glands, patient denies easy bruising or easy bleeding. Patient denies any recent infections, allergies or URI. Patient "s visual fields have not changed significantly in recent time.   Nursing Notes:  Nursing Vital Signs and Chemo Nursing Nursing Notes: *CC Vital Signs Flowsheet:   22-Feb-16 09:40  Temp Temperature 95.3  Pulse Pulse 91  Respirations Respirations 18  SBP SBP 158  DBP DBP 90  Pain Scale (0-10)  0  Current Weight (kg) (kg) 81.2  Height (cm) centimeters 172  BSA (m2) 1.9    10:50  Pulse Pulse 90  SBP SBP 164  DBP DBP 90   Physical Exam:  General/Skin/HEENT:  General normal   Skin normal   Eyes normal   Additional PE well-developed male in NAD. T Ctr. fair state of repair. Oral cavity is clear. No oral mucosal lesions are identified. On indirect mirror there is some fullness the right tongue base. Otherwise upper airways clear vallecula is within normal limits. Right neck has an enlarged proximal 3 cm right mid cervical node compatible with above stated diagnosis. Left neck is clear super clavicular fossa is clear bilaterally. Lungs are clear to A&P cardiac examination shows regular rate and rhythm.   Breasts/Resp/CV/GI/GU:  Respiratory and Thorax normal   Cardiovascular normal   Gastrointestinal normal   Genitourinary normal   MS/Neuro/Psych/Lymph:  Musculoskeletal normal   Neurological normal   Lymphatics normal   Other Results:  Radiology Results: CT:    04-Feb-16 10:53, CT Neck With Contrast  CT Neck With Contrast   REASON FOR EXAM:    LABS FIRST   R Neck Mass   Lymphadenopathy  COMMENTS:       PROCEDURE: MCT - MCT NECK WITH CONTRAST  - Mar 14 2014 10:53AM     CLINICAL DATA:  Right neck mass.  Bilateral endarterectomy is.    EXAM:  CT NECK WITH CONTRAST    TECHNIQUE:  Multidetector CT imaging of the neck was performed using the  standard protocol following the bolus administration of intravenous  contrast.  CONTRAST:  75 mL Isovue-300    COMPARISON:  CTA neck 08/16/2012    FINDINGS:  Pharynx and larynx: Asymmetric soft tissue irregularity is present  at the right tongue base. This area should be amenable to direct  visualization. No other focal mucosal or submucosal lesions are  evident. The vocal cords are midline and symmetric.    Salivary glands: Negative    Thyroid: Thyroid is somewhat heterogeneous. There is mild asymmetry  of the right lobe. The largest individual lesion is in the right  lobe measuring 8 mm maximally.  Lymph nodes: A peripherally enhancing necrotic right level 3 node or  nodal mass measures 3.8 x 2.6 x 2.6 cm. Additional hyperdense right  level 3 node just above this measures 9 mm. A small hyperdense noted  is present just  below this is well. There is indistinguishable fat  planes between the a necrotic nodal mass and a overlying  sternocleidomastoid muscle.    No significant level 2 adenopathy is present. A benign appearing  right level 1 lymph node is present.    No significant supraclavicular or axillary adenopathy is present.    Vascular: The carotid bifurcations arepatent bilaterally. Bilateral  endarterectomy is are evident.  Limited intracranial: Negative.    Visualized orbits: Not visualized.    Mastoids and visualized paranasal sinuses: Clear.    Skeleton: Multilevel spondylosis is present in the cervical spine  with uncovertebral disease throughout. No focal lytic or blastic  lesions are evident. The patient is status post median sternotomy.    Upper chest: Moderate bilateral pleural  effusions are present. There  is associated dependent atelectasis. Nofocal nodule, mass, or  significant airspace disease is present. A borderline 10 mm short  access precarinal lymph node is present. No other significant  mediastinal adenopathy is present. Atherosclerotic calcifications  are present in the aorta.     IMPRESSION:  1. 3.8 cm necrotic peripherally enhancing node or nodal mass at the  right level 3 station. No definite primary lesion is evident. A  mucosal primary is suspected.  2. At least 2 additional smaller hyperdense level 3 nodes are also  concerning for metastatic disease.  3. Asymmetric enhancing mucosal tissue at the right tongue base.  Recommend direct visualization as a source of primary tumor.  4. Bilateral pleural effusions with associated atelectasis.  5. Bb borderline 10 mm precarinal lymph node may be reactive.  6. Atherosclerosis.  Bilateral endarterectomy is are noted.  7. Multilevel spondylosis of the cervical spine.  Electronically Signed    By: Lawrence Santiago M.D.    On: 03/14/2014 21:55         Verified By: Resa Miner. MATTERN, M.D.,  Nuclear Med:    16-Feb-16 14:54, PET/CT Scan Head/Neck CA Initial Staging  PET/CT Scan Head/Neck CA Initial Staging   REASON FOR EXAM:    Head Neck CA  COMMENTS:       PROCEDURE: PET - PET/CT INIT STAG HEAD/NECK CA  - Mar 26 2014  2:54PM     CLINICAL DATA:  Subsequent treatment strategy for head and neck  carcinoma.. Squamous cell carcinoma of the right neck.    EXAM:  NUCLEAR MEDICINE PET SKULL BASE TO THIGH    TECHNIQUE:  12.4 mCi F-18 FDG was injected intravenously. Full-ring PET imaging  was performed from the skull base to thigh after the radiotracer. CT  data was obtained and used for attenuation correction and anatomic  localization.    FASTING BLOOD GLUCOSE:  Value: 109 mg/dl    COMPARISON:  CT neck 03/14/1998 16    FINDINGS:  NECK    Hypermetabolic discussed at asymmetric hypermetabolic  activity at  the RIGHT base of tongue with SUV max 11.4.    There is enlarged hypermetabolic RIGHT level 3 lymph node measuring  27 mm short axis with SUV max 4.6. There are no clear additional  hypermetabolic nodes on the right. No contralateral hypermetabolic  adenopathy.    CHEST    Small prevascular nodule measuring 7 mm which is high-density (image  133, series 2) has mild metabolic activity. Lower right paratracheal  lymph node is also mildly high-density and measuring 11 mm with no  significant metabolic activity. No hypermetabolic hilar lymph nodes.  No suspicious pulmonary nodules. There bilateral pleural effusions.    ABDOMEN/PELVIS  No abnormal hypermetabolic activity within the liver, pancreas,  adrenal glands, or spleen. No hypermetabolic lymph nodes in the  abdomen or pelvis.  SKELETON    No focal hypermetabolic activity to suggest skeletal metastasis.     IMPRESSION:  1. Hypermetabolic asymmetric thickening at the right base of tongue  consistent with primary carcinoma.  2. Hypermetabolic level 3 lymph node on the RIGHT. No contralateral  hypermetabolic lymph nodes.  3. Single small high-density nodule in the mediastinum anterior to  trachea with mild metabolic activity. This may represent  granulomatous disease. Cannot exclude metastatic disease but less  favored.    Electronically Signed    By: Suzy Bouchard M.D.    On: 03/26/2014 17:22         Verified By: Rennis Golden, M.D.,   Relevent Results:   Relevant Scans and Labs PET/CT and CT scans are reviewed compatible with above-stated findings   Assessment and Plan: Impression:   stage III (T2 N2 B M0), cell carcinoma base of tongue in 74 year old male with significant cardiac history Plan:   at this time I have recommended combined modality treatment with chemotherapy and radiation therapy. Would plan on delivering 7000 cGy to his head and neck field incorporating his primary tumor and  involved lymph nodes by PET/CT criteria. I would use IM RT dose painting technique to treat the rest of his lymph nodes in his head and neck region to 5400 cGy. Risks and benefits of treatment including xerostomia, oral mucositis, alteration of taste, skin reaction, fatigue, dysphagia, all were described in detail to the patient. He seems to [place] and her treatment plan well. I have set up and ordered CT simulation the patient later this week. I discussed the case personally with medical oncology will be offering chemotherapy concurrently.  I would like to take this opportunity for allowing me to participate in the care of your patient..  Fax to Physician:  Physicians To Recieve Fax: Juline Patch, MD - [phone] Huey Romans, MD - [phone].  Electronic Signatures: Nariyah Osias, Roda Shutters (MD)  (Signed [phone] 11:38)  Authored: HPI, Diagnosis, Past Hx, PFSH, Allergies, Home Meds, ROS, Nursing Notes, Physical Exam, Other Results, Relevent Results, Encounter Assessment and Plan, Fax to Physician   Last Updated: 22-Feb-16 11:38 by Armstead Peaks (MD)

## 2014-06-10 ENCOUNTER — Ambulatory Visit: Payer: Medicare Other | Admitting: Radiation Oncology

## 2014-06-10 ENCOUNTER — Ambulatory Visit
Admission: RE | Admit: 2014-06-10 | Discharge: 2014-06-10 | Disposition: A | Discharge status: Home / Self Care | Payer: Medicare Other | Source: Ambulatory Visit | Attending: Radiation Oncology | Admitting: Radiation Oncology

## 2014-06-10 DIAGNOSIS — C01 Malignant neoplasm of base of tongue: Secondary | ICD-10-CM | POA: Diagnosis not present

## 2014-06-10 DIAGNOSIS — Z510 Encounter for antineoplastic radiation therapy: Secondary | ICD-10-CM | POA: Diagnosis present

## 2014-06-10 DIAGNOSIS — C770 Secondary and unspecified malignant neoplasm of lymph nodes of head, face and neck: Secondary | ICD-10-CM | POA: Insufficient documentation

## 2014-06-11 ENCOUNTER — Ambulatory Visit
Admission: RE | Admit: 2014-06-11 | Discharge: 2014-06-11 | Disposition: A | Discharge status: Home / Self Care | Payer: Medicare Other | Source: Ambulatory Visit | Attending: Radiation Oncology | Admitting: Radiation Oncology

## 2014-06-11 DIAGNOSIS — H4011X1 Primary open-angle glaucoma, mild stage: Secondary | ICD-10-CM | POA: Diagnosis not present

## 2014-06-11 DIAGNOSIS — C770 Secondary and unspecified malignant neoplasm of lymph nodes of head, face and neck: Secondary | ICD-10-CM | POA: Diagnosis not present

## 2014-06-11 DIAGNOSIS — C01 Malignant neoplasm of base of tongue: Secondary | ICD-10-CM | POA: Diagnosis not present

## 2014-06-12 ENCOUNTER — Ambulatory Visit
Admission: RE | Admit: 2014-06-12 | Discharge: 2014-06-12 | Disposition: A | Discharge status: Home / Self Care | Payer: Medicare Other | Source: Ambulatory Visit | Attending: Radiation Oncology | Admitting: Radiation Oncology

## 2014-06-12 DIAGNOSIS — C770 Secondary and unspecified malignant neoplasm of lymph nodes of head, face and neck: Secondary | ICD-10-CM | POA: Diagnosis not present

## 2014-06-12 DIAGNOSIS — C01 Malignant neoplasm of base of tongue: Secondary | ICD-10-CM | POA: Diagnosis not present

## 2014-06-13 ENCOUNTER — Ambulatory Visit
Admission: RE | Admit: 2014-06-13 | Discharge: 2014-06-13 | Disposition: A | Discharge status: Home / Self Care | Payer: Medicare Other | Source: Ambulatory Visit | Attending: Radiation Oncology | Admitting: Radiation Oncology

## 2014-06-13 DIAGNOSIS — C01 Malignant neoplasm of base of tongue: Secondary | ICD-10-CM | POA: Diagnosis not present

## 2014-06-13 DIAGNOSIS — C770 Secondary and unspecified malignant neoplasm of lymph nodes of head, face and neck: Secondary | ICD-10-CM | POA: Diagnosis not present

## 2014-06-14 ENCOUNTER — Ambulatory Visit
Admission: RE | Admit: 2014-06-14 | Discharge: 2014-06-14 | Disposition: A | Discharge status: Home / Self Care | Payer: Medicare Other | Source: Ambulatory Visit | Attending: Radiation Oncology | Admitting: Radiation Oncology

## 2014-06-14 DIAGNOSIS — C770 Secondary and unspecified malignant neoplasm of lymph nodes of head, face and neck: Secondary | ICD-10-CM | POA: Diagnosis not present

## 2014-06-14 DIAGNOSIS — C01 Malignant neoplasm of base of tongue: Secondary | ICD-10-CM | POA: Diagnosis not present

## 2014-07-02 DIAGNOSIS — I10 Essential (primary) hypertension: Secondary | ICD-10-CM | POA: Diagnosis not present

## 2014-07-02 DIAGNOSIS — I25810 Atherosclerosis of coronary artery bypass graft(s) without angina pectoris: Secondary | ICD-10-CM | POA: Diagnosis not present

## 2014-07-02 DIAGNOSIS — I340 Nonrheumatic mitral (valve) insufficiency: Secondary | ICD-10-CM | POA: Diagnosis not present

## 2014-07-02 DIAGNOSIS — I5022 Chronic systolic (congestive) heart failure: Secondary | ICD-10-CM | POA: Diagnosis not present

## 2014-07-17 ENCOUNTER — Other Ambulatory Visit: Payer: Self-pay | Admitting: *Deleted

## 2014-07-17 DIAGNOSIS — C029 Malignant neoplasm of tongue, unspecified: Secondary | ICD-10-CM

## 2014-07-18 ENCOUNTER — Ambulatory Visit: Payer: Medicare Other

## 2014-07-18 ENCOUNTER — Inpatient Hospital Stay: Payer: Medicare Other | Attending: Oncology

## 2014-07-18 DIAGNOSIS — Z7982 Long term (current) use of aspirin: Secondary | ICD-10-CM | POA: Insufficient documentation

## 2014-07-18 DIAGNOSIS — Z951 Presence of aortocoronary bypass graft: Secondary | ICD-10-CM | POA: Insufficient documentation

## 2014-07-18 DIAGNOSIS — I10 Essential (primary) hypertension: Secondary | ICD-10-CM | POA: Insufficient documentation

## 2014-07-18 DIAGNOSIS — E119 Type 2 diabetes mellitus without complications: Secondary | ICD-10-CM | POA: Insufficient documentation

## 2014-07-18 DIAGNOSIS — I2510 Atherosclerotic heart disease of native coronary artery without angina pectoris: Secondary | ICD-10-CM | POA: Insufficient documentation

## 2014-07-18 DIAGNOSIS — Z79899 Other long term (current) drug therapy: Secondary | ICD-10-CM | POA: Insufficient documentation

## 2014-07-18 DIAGNOSIS — Z9221 Personal history of antineoplastic chemotherapy: Secondary | ICD-10-CM | POA: Insufficient documentation

## 2014-07-18 DIAGNOSIS — I252 Old myocardial infarction: Secondary | ICD-10-CM | POA: Insufficient documentation

## 2014-07-18 DIAGNOSIS — Z8673 Personal history of transient ischemic attack (TIA), and cerebral infarction without residual deficits: Secondary | ICD-10-CM | POA: Insufficient documentation

## 2014-07-18 DIAGNOSIS — C01 Malignant neoplasm of base of tongue: Secondary | ICD-10-CM | POA: Insufficient documentation

## 2014-07-18 DIAGNOSIS — N289 Disorder of kidney and ureter, unspecified: Secondary | ICD-10-CM | POA: Insufficient documentation

## 2014-07-22 ENCOUNTER — Inpatient Hospital Stay (HOSPITAL_BASED_OUTPATIENT_CLINIC_OR_DEPARTMENT_OTHER): Payer: Medicare Other | Admitting: Oncology

## 2014-07-22 ENCOUNTER — Encounter: Payer: Self-pay | Admitting: Radiation Oncology

## 2014-07-22 ENCOUNTER — Inpatient Hospital Stay
Admission: RE | Admit: 2014-07-22 | Discharge: 2014-07-22 | Disposition: A | Discharge status: Home / Self Care | Payer: Medicare Other | Source: Ambulatory Visit | Attending: Radiation Oncology | Admitting: Radiation Oncology

## 2014-07-22 VITALS — BP 128/78 | HR 92 | Temp 94.1°F | Resp 20 | Wt 148.4 lb

## 2014-07-22 DIAGNOSIS — Z951 Presence of aortocoronary bypass graft: Secondary | ICD-10-CM | POA: Diagnosis not present

## 2014-07-22 DIAGNOSIS — Z79899 Other long term (current) drug therapy: Secondary | ICD-10-CM

## 2014-07-22 DIAGNOSIS — E119 Type 2 diabetes mellitus without complications: Secondary | ICD-10-CM | POA: Diagnosis not present

## 2014-07-22 DIAGNOSIS — C01 Malignant neoplasm of base of tongue: Secondary | ICD-10-CM | POA: Diagnosis not present

## 2014-07-22 DIAGNOSIS — N289 Disorder of kidney and ureter, unspecified: Secondary | ICD-10-CM | POA: Diagnosis not present

## 2014-07-22 DIAGNOSIS — I252 Old myocardial infarction: Secondary | ICD-10-CM | POA: Diagnosis not present

## 2014-07-22 DIAGNOSIS — Z9221 Personal history of antineoplastic chemotherapy: Secondary | ICD-10-CM | POA: Diagnosis not present

## 2014-07-22 DIAGNOSIS — Z7982 Long term (current) use of aspirin: Secondary | ICD-10-CM

## 2014-07-22 DIAGNOSIS — I2510 Atherosclerotic heart disease of native coronary artery without angina pectoris: Secondary | ICD-10-CM

## 2014-07-22 DIAGNOSIS — Z8673 Personal history of transient ischemic attack (TIA), and cerebral infarction without residual deficits: Secondary | ICD-10-CM | POA: Diagnosis not present

## 2014-07-22 DIAGNOSIS — I10 Essential (primary) hypertension: Secondary | ICD-10-CM

## 2014-07-22 NOTE — Progress Notes (Signed)
Radiation Oncology Follow up Note  Name: Ricky Rangel   Date:   07/22/2014 MRN:  [id number] DOB: 20-Jul-1940    This 74 y.o. male presents to the clinic today for follow-up for head and neck cancer stage IIIa (T2 N2 B M0) squamous cell carcinoma the base of tongue status post concurrent chemotherapy and radiation therapy now 1 month out.Marland Kitchen  REFERRING PROVIDER: Ladene Artist HPI: Patient is a 74 year old male initially presented the right neck mass unresponsive anabiotic therapy fine needle aspiration positive for squamous cell carcinoma CT scan showed 4 cm level III necrotic neck node and PET scan demonstrated hypermetabolic activity in the base of tongue. He was treated with concurrent chemotherapy and radiation with curative intent and is now seen out 1 month. He is doing well. Taste is still altered which we would expect at this time. He specifically denies head and neck pain or dysphagia..  COMPLICATIONS OF TREATMENT: none  FOLLOW UP COMPLIANCE: keeps appointments   PHYSICAL EXAM:  BP 128/78 mmHg  Pulse 92  Temp(Src) 94.1 F (34.5 C)  Resp 20  Wt 148 lb 5.9 oz (67.3 kg) Oral cavity is clear with no oral mucosal lesions identified. Indirect mirror examination shows base of tongue within normal limits indirect mirror also shows upper airway clear vallecula within normal limits. Neck is clear without evidence of subject gastric cervical or supraclavicular adenopathy. Well-developed well-nourished patient in NAD. HEENT reveals PERLA, EOMI, discs not visualized.  Oral cavity is clear. No oral mucosal lesions are identified. Neck is clear without evidence of cervical or supraclavicular adenopathy. Lungs are clear to A&P. Cardiac examination is essentially unremarkable with regular rate and rhythm without murmur rub or thrill. Abdomen is benign with no organomegaly or masses noted. Motor sensory and DTR levels are equal and symmetric in the upper and lower extremities. Cranial nerves II through XII  are grossly intact. Proprioception is intact. No peripheral adenopathy or edema is identified. No motor or sensory levels are noted. Crude visual fields are within normal range.   RADIOLOGY RESULTS: PET CT scan will be ordered in about 4 months  PLAN: Present time he is doing well with no apparent disease. I've set up a follow-up appointment with ENT in the next several weeks. ENT will be keeping close follow-up exam on the patient. I'm otherwise please was overall progress. I've asked to see him back in 4-5 months for follow-up. Will order PET/CT at that time if it is not already been ordered. Patient knows to call sooner with any concerns. I would like to take this opportunity for allowing me to participate in the care of your patient.Armstead Peaks., MD

## 2014-08-05 ENCOUNTER — Other Ambulatory Visit: Payer: Self-pay | Admitting: *Deleted

## 2014-08-05 ENCOUNTER — Ambulatory Visit
Admission: RE | Admit: 2014-08-05 | Discharge: 2014-08-05 | Disposition: A | Discharge status: Home / Self Care | Payer: Medicare Other | Source: Ambulatory Visit | Attending: Oncology | Admitting: Oncology

## 2014-08-05 DIAGNOSIS — I25810 Atherosclerosis of coronary artery bypass graft(s) without angina pectoris: Secondary | ICD-10-CM | POA: Insufficient documentation

## 2014-08-05 DIAGNOSIS — I639 Cerebral infarction, unspecified: Secondary | ICD-10-CM | POA: Insufficient documentation

## 2014-08-05 DIAGNOSIS — E1310 Other specified diabetes mellitus with ketoacidosis without coma: Secondary | ICD-10-CM | POA: Insufficient documentation

## 2014-08-05 DIAGNOSIS — Z9889 Other specified postprocedural states: Secondary | ICD-10-CM | POA: Diagnosis not present

## 2014-08-05 DIAGNOSIS — C029 Malignant neoplasm of tongue, unspecified: Secondary | ICD-10-CM | POA: Diagnosis not present

## 2014-08-05 DIAGNOSIS — J90 Pleural effusion, not elsewhere classified: Secondary | ICD-10-CM | POA: Diagnosis not present

## 2014-08-05 DIAGNOSIS — C760 Malignant neoplasm of head, face and neck: Secondary | ICD-10-CM

## 2014-08-05 LAB — GLUCOSE, CAPILLARY: Glucose-Capillary: 114 mg/dL — ABNORMAL HIGH (ref 65–99)

## 2014-08-05 MED ORDER — FLUDEOXYGLUCOSE F - 18 (FDG) INJECTION
11.3000 | Freq: Once | INTRAVENOUS | Status: AC | PRN
  Administered 2014-08-05: 11.3 via INTRAVENOUS

## 2014-08-08 ENCOUNTER — Ambulatory Visit: Payer: Medicare Other | Admitting: Oncology

## 2014-08-08 ENCOUNTER — Ambulatory Visit: Payer: Medicare Other

## 2014-08-08 ENCOUNTER — Inpatient Hospital Stay (HOSPITAL_BASED_OUTPATIENT_CLINIC_OR_DEPARTMENT_OTHER): Payer: Medicare Other | Admitting: Oncology

## 2014-08-08 VITALS — BP 121/81 | HR 105 | Temp 95.4°F | Resp 18

## 2014-08-08 DIAGNOSIS — I252 Old myocardial infarction: Secondary | ICD-10-CM

## 2014-08-08 DIAGNOSIS — Z951 Presence of aortocoronary bypass graft: Secondary | ICD-10-CM | POA: Diagnosis not present

## 2014-08-08 DIAGNOSIS — Z9221 Personal history of antineoplastic chemotherapy: Secondary | ICD-10-CM

## 2014-08-08 DIAGNOSIS — I2510 Atherosclerotic heart disease of native coronary artery without angina pectoris: Secondary | ICD-10-CM

## 2014-08-08 DIAGNOSIS — N289 Disorder of kidney and ureter, unspecified: Secondary | ICD-10-CM

## 2014-08-08 DIAGNOSIS — Z7982 Long term (current) use of aspirin: Secondary | ICD-10-CM | POA: Diagnosis not present

## 2014-08-08 DIAGNOSIS — E119 Type 2 diabetes mellitus without complications: Secondary | ICD-10-CM | POA: Diagnosis not present

## 2014-08-08 DIAGNOSIS — Z8673 Personal history of transient ischemic attack (TIA), and cerebral infarction without residual deficits: Secondary | ICD-10-CM

## 2014-08-08 DIAGNOSIS — I10 Essential (primary) hypertension: Secondary | ICD-10-CM

## 2014-08-08 DIAGNOSIS — Z79899 Other long term (current) drug therapy: Secondary | ICD-10-CM

## 2014-08-08 DIAGNOSIS — C01 Malignant neoplasm of base of tongue: Secondary | ICD-10-CM | POA: Diagnosis not present

## 2014-08-08 DIAGNOSIS — C760 Malignant neoplasm of head, face and neck: Secondary | ICD-10-CM

## 2014-08-08 NOTE — Progress Notes (Signed)
Patient is still feeling fatigued and has not gained his taste back.

## 2014-08-08 NOTE — Progress Notes (Signed)
Survivorship visit complete.  Treatment summary reviewed and given to patient.  [facility] resources given and encouraged to utilized.  [facility] survivorship booklet reviewed and given to patient.

## 2014-08-11 ENCOUNTER — Other Ambulatory Visit: Payer: Self-pay | Admitting: Family Medicine

## 2014-08-11 DIAGNOSIS — I10 Essential (primary) hypertension: Secondary | ICD-10-CM

## 2014-08-11 NOTE — Progress Notes (Signed)
[facility]  Telephone:([phone] Fax:([phone]) [phone]  ID: Ricky Rangel OB: 1940-11-05  MR#: [id number]  PJA#:[id number]  Patient Care Team: Juline Patch, MD as PCP - General (Family Medicine) Noreene Filbert, MD as Referring Physician (Radiation Oncology)  CHIEF COMPLAINT: No chief complaint on file.   INTERVAL HISTORY: Patient returns to clinic today for further evaluation and laboratory work. He completed his chemotherapy on June 04, 2014. He has missed several appointments in the interim, including his PET scan. He currently feels well. He denies any neurologic complaints. He has no recent fevers or illnesses. He denies any chest pain, hemoptysis, cough, or shortness of breath. He denies any nausea, vomiting, constipation, or diarrhea. He has no urinary complaints. Patient offers no specific complaints today.    REVIEW OF SYSTEMS:   Review of Systems  Constitutional: Negative.   HENT: Negative.   Respiratory: Negative.     As per HPI. Otherwise, a complete review of systems is negatve.  PAST MEDICAL HISTORY:  CAD status post MI, CABG, CVA, diabetes, hypertension, carotid endarterectomy  PAST SURGICAL HISTORY: CABG  FAMILY HISTORY: Reviewed and unchanged. No report of malignancy or other chronic disease.     ADVANCED DIRECTIVES:    HEALTH MAINTENANCE: History  Substance Use Topics  . Smoking status: Never Smoker   . Smokeless tobacco: Never Used  . Alcohol Use: No     Colonoscopy:  PAP:  Bone density:  Lipid panel:  Allergies  Allergen Reactions  . No Known Allergies     Current Outpatient Prescriptions  Medication Sig Dispense Refill  . aspirin EC 81 MG tablet Take 81 mg by mouth.    Marland Kitchen atorvastatin (LIPITOR) 40 MG tablet Take 40 mg by mouth at bedtime.    . benazepril (LOTENSIN) 40 MG tablet     . carvedilol (COREG) 25 MG tablet     . clopidogrel (PLAVIX) 75 MG tablet Take 75 mg by mouth daily.    . dorzolamide-timolol  (COSOPT) 22.3-6.8 MG/ML ophthalmic solution Frequency:BID   Dosage:0.0     Instructions:  Note:Dose: 2%-0.5% TO OS    . latanoprost (XALATAN) 0.005 % ophthalmic solution     . prochlorperazine (COMPAZINE) 10 MG tablet Take 10 mg by mouth every 6 (six) hours as needed for nausea or vomiting.    . ranitidine (ZANTAC) 300 MG tablet Take 300 mg by mouth daily.    Marland Kitchen spironolactone (ALDACTONE) 25 MG tablet Take 12.5 mg by mouth daily.     No current facility-administered medications for this visit.    OBJECTIVE: There were no vitals filed for this visit.   There is no weight on file to calculate BMI.    ECOG FS:0 - Asymptomatic  General: Well-developed, well-nourished, no acute distress. Eyes: anicteric sclera. HEENT: Normocephalic, moist mucous membranes, clear oropharnyx. No palpable lymphadenopathy. Lungs: Clear to auscultation bilaterally. Heart: Regular rate and rhythm. No rubs, murmurs, or gallops. Abdomen: Soft, nontender, nondistended. No organomegaly noted, normoactive bowel sounds. Musculoskeletal: No edema, cyanosis, or clubbing. Neuro: Alert, answering all questions appropriately. Cranial nerves grossly intact. Skin: No rashes or petechiae noted. Psych: Normal affect.   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  PLAN:    1. Squamous cell carcinoma: Confirmed by  biopsy results. Patient is now completed his treatments. He missed his scheduled PET scan recently, therefore this will be scheduled in the next several weeks. Return to clinic several days after to discuss the results. Patient will also require survivorship visit in the near future.  2. Renal insufficiency: Patient's creatinine is now within normal limits.  3. Diabetes: Continue current prescriptions as prescribed.  Patient expressed understanding and was in agreement with this plan. He also understands that He can call clinic at any time with any questions, concerns, or complaints.    Lloyd Huger, MD   08/11/2014 4:02 PM

## 2014-08-13 ENCOUNTER — Telehealth: Payer: Self-pay

## 2014-08-13 NOTE — Telephone Encounter (Signed)
Appointment with Dr. Kathyrn Sheriff on 07/22/14 @ 2:40

## 2014-08-19 NOTE — Progress Notes (Signed)
[place]  Telephone:([phone]) [phone] Fax:(336) [phone]  ID: Jomarie Longs OB: 04/17/1940  MR#: [id number]  TJQ#:[id number]  Patient Care Team: Juline Patch, MD as PCP - General (Family Medicine) Noreene Filbert, MD as Referring Physician (Radiation Oncology)  CHIEF COMPLAINT:  Chief Complaint  Patient presents with  . Follow-up    survivorship appointment/squamous cell carcinoma     INTERVAL HISTORY: Patient returns to clinic today for further evaluation and his survivorship visit. He currently feels well. He denies any dysphasia. He denies any neurologic complaints. He has no recent fevers or illnesses. He denies any chest pain, hemoptysis, cough, or shortness of breath. He denies any nausea, vomiting, constipation, or diarrhea. He has no urinary complaints. Patient offers no specific complaints today.   REVIEW OF SYSTEMS:   Review of Systems  Constitutional: Negative.   HENT: Negative.   Respiratory: Negative.     As per HPI. Otherwise, a complete review of systems is negatve.  PAST MEDICAL HISTORY:  CAD status post MI, CABG, CVA, diabetes, hypertension, carotid endarterectomy  PAST SURGICAL HISTORY: CABG  FAMILY HISTORY: Reviewed and unchanged. No report of malignancy or other chronic disease.     ADVANCED DIRECTIVES:    HEALTH MAINTENANCE: History  Substance Use Topics  . Smoking status: Never Smoker   . Smokeless tobacco: Never Used  . Alcohol Use: No     Colonoscopy:  PAP:  Bone density:  Lipid panel:  Allergies  Allergen Reactions  . No Known Allergies     Current Outpatient Prescriptions  Medication Sig Dispense Refill  . aspirin EC 81 MG tablet Take 81 mg by mouth.    Marland Kitchen atorvastatin (LIPITOR) 40 MG tablet Take 40 mg by mouth at bedtime.    . benazepril (LOTENSIN) 40 MG tablet     . carvedilol (COREG) 25 MG tablet     . clopidogrel (PLAVIX) 75 MG tablet Take 75 mg by mouth daily.    . dorzolamide-timolol (COSOPT)  22.3-6.8 MG/ML ophthalmic solution Frequency:BID   Dosage:0.0     Instructions:  Note:Dose: 2%-0.5% TO OS    . latanoprost (XALATAN) 0.005 % ophthalmic solution     . prochlorperazine (COMPAZINE) 10 MG tablet Take 10 mg by mouth every 6 (six) hours as needed for nausea or vomiting.    . ranitidine (ZANTAC) 300 MG tablet Take 300 mg by mouth daily.    Marland Kitchen spironolactone (ALDACTONE) 25 MG tablet Take 12.5 mg by mouth daily.    Marland Kitchen losartan (COZAAR) 50 MG tablet TAKE 1 TABLET BY MOUTH ONCE DAILY 30 tablet 1   No current facility-administered medications for this visit.    OBJECTIVE: Filed Vitals:   08/08/14 1517  BP: 121/81  Pulse: 105  Temp: 95.4 F (35.2 C)  Resp: 18     There is no weight on file to calculate BMI.    ECOG FS:0 - Asymptomatic  General: Well-developed, well-nourished, no acute distress. Eyes: anicteric sclera. HEENT: Normocephalic, moist mucous membranes, clear oropharnyx. No palpable lymphadenopathy. Lungs: Clear to auscultation bilaterally. Heart: Regular rate and rhythm. No rubs, murmurs, or gallops. Abdomen: Soft, nontender, nondistended. No organomegaly noted, normoactive bowel sounds. Musculoskeletal: No edema, cyanosis, or clubbing. Neuro: Alert, answering all questions appropriately. Cranial nerves grossly intact. Skin: No rashes or petechiae noted. Psych: Normal affect.   LAB RESULTS:  Lab Results  Component Value Date   NA 137 06/04/2014   K 4.0 06/04/2014   CL 104 06/04/2014   CO2 27 06/04/2014   GLUCOSE  157* 06/04/2014   BUN 28* 06/04/2014   CREATININE 1.13 06/04/2014   CALCIUM 9.2 06/04/2014   PROT 7.2 01/31/2012   ALBUMIN 3.7 01/31/2012   AST 38* 01/31/2012   ALT 31 01/31/2012   ALKPHOS 80 01/31/2012   GFRNONAA >60 06/04/2014   GFRAA >60 06/04/2014    Lab Results  Component Value Date   WBC 4.3 06/04/2014   NEUTROABS 2.6 06/04/2014   HGB 13.7 06/04/2014   HCT 40.3 06/04/2014   MCV 92 06/04/2014   PLT 148* 06/04/2014     STUDIES:  PET scan on August 05, 2014  1. No metabolically active tissue at the base of tongue. 2. No hypermetabolic cervical lymph nodes. Right neck dissection 3. No evidence of distant metastasis. 4. Decreased left pleural effusion.   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  PLAN:    1. Squamous cell carcinoma: No evidence of disease. PET scan results as above reviewed independently. Patient has been instructed to keep his previously scheduled follow-up appointment in approximately 3 months for routine evaluation. He is also been instructed to keep his ENT follow-up as scheduled.  2. Renal insufficiency: Patient's creatinine is now within normal limits.  3. Diabetes: Continue current prescriptions as prescribed. 4. Survivorship: Patient's long-term followup, potential side effects, as well as laboratory work and chemotherapy were all discussed at length and in detail with the patient.  Patient expressed understanding and was in agreement with this plan. He also understands that He can call clinic at any time with any questions, concerns, or complaints.    Lloyd Huger, MD   08/19/2014 4:12 PM

## 2014-08-21 DIAGNOSIS — Z85818 Personal history of malignant neoplasm of other sites of lip, oral cavity, and pharynx: Secondary | ICD-10-CM | POA: Diagnosis not present

## 2014-09-05 ENCOUNTER — Other Ambulatory Visit: Payer: Self-pay

## 2014-09-05 ENCOUNTER — Ambulatory Visit: Payer: Self-pay | Admitting: Family Medicine

## 2014-09-05 ENCOUNTER — Emergency Department
Admission: EM | Admit: 2014-09-05 | Discharge: 2014-09-05 | Disposition: A | Discharge status: Home / Self Care | Payer: Medicare Other | Attending: Emergency Medicine | Admitting: Emergency Medicine

## 2014-09-05 DIAGNOSIS — M6281 Muscle weakness (generalized): Secondary | ICD-10-CM | POA: Diagnosis not present

## 2014-09-05 DIAGNOSIS — F329 Major depressive disorder, single episode, unspecified: Secondary | ICD-10-CM | POA: Diagnosis not present

## 2014-09-05 DIAGNOSIS — R531 Weakness: Secondary | ICD-10-CM

## 2014-09-05 DIAGNOSIS — R634 Abnormal weight loss: Secondary | ICD-10-CM | POA: Insufficient documentation

## 2014-09-05 DIAGNOSIS — F32A Depression, unspecified: Secondary | ICD-10-CM

## 2014-09-05 LAB — URINALYSIS COMPLETE WITH MICROSCOPIC (ARMC ONLY)
BACTERIA UA: NONE SEEN
BILIRUBIN URINE: NEGATIVE
Glucose, UA: 50 mg/dL — AB
KETONES UR: NEGATIVE mg/dL
LEUKOCYTES UA: NEGATIVE
Nitrite: NEGATIVE
Protein, ur: NEGATIVE mg/dL
Specific Gravity, Urine: 1.016 (ref 1.005–1.030)
pH: 5 (ref 5.0–8.0)

## 2014-09-05 LAB — CBC WITH DIFFERENTIAL/PLATELET
Basophils Absolute: 0 10*3/uL (ref 0–0.1)
Basophils Relative: 0 %
EOS ABS: 0.1 10*3/uL (ref 0–0.7)
Eosinophils Relative: 1 %
HEMATOCRIT: 36.5 % — AB (ref 40.0–52.0)
Hemoglobin: 12.6 g/dL — ABNORMAL LOW (ref 13.0–18.0)
LYMPHS PCT: 8 %
Lymphs Abs: 0.5 10*3/uL — ABNORMAL LOW (ref 1.0–3.6)
MCH: 33 pg (ref 26.0–34.0)
MCHC: 34.5 g/dL (ref 32.0–36.0)
MCV: 95.7 fL (ref 80.0–100.0)
MONOS PCT: 9 %
Monocytes Absolute: 0.5 10*3/uL (ref 0.2–1.0)
NEUTROS ABS: 4.8 10*3/uL (ref 1.4–6.5)
Neutrophils Relative %: 82 %
Platelets: 198 10*3/uL (ref 150–440)
RBC: 3.82 MIL/uL — ABNORMAL LOW (ref 4.40–5.90)
RDW: 13.4 % (ref 11.5–14.5)
WBC: 5.9 10*3/uL (ref 3.8–10.6)

## 2014-09-05 LAB — COMPREHENSIVE METABOLIC PANEL
ALBUMIN: 3.8 g/dL (ref 3.5–5.0)
ALK PHOS: 62 U/L (ref 38–126)
ALT: 12 U/L — AB (ref 17–63)
ANION GAP: 8 (ref 5–15)
AST: 20 U/L (ref 15–41)
BUN: 33 mg/dL — AB (ref 6–20)
CO2: 26 mmol/L (ref 22–32)
Calcium: 9.2 mg/dL (ref 8.9–10.3)
Chloride: 97 mmol/L — ABNORMAL LOW (ref 101–111)
Creatinine, Ser: 0.98 mg/dL (ref 0.61–1.24)
GLUCOSE: 197 mg/dL — AB (ref 65–99)
POTASSIUM: 4.6 mmol/L (ref 3.5–5.1)
Sodium: 131 mmol/L — ABNORMAL LOW (ref 135–145)
Total Bilirubin: 0.5 mg/dL (ref 0.3–1.2)
Total Protein: 7 g/dL (ref 6.5–8.1)

## 2014-09-05 LAB — TROPONIN I

## 2014-09-05 MED ORDER — SODIUM CHLORIDE 0.9 % IV BOLUS (SEPSIS)
1000.0000 mL | Freq: Once | INTRAVENOUS | Status: AC
  Administered 2014-09-05: 1000 mL via INTRAVENOUS

## 2014-09-05 NOTE — ED Notes (Signed)
Weight loss, weakness, diarrhea X 1 year. Daughter brought pt intoday.

## 2014-09-05 NOTE — ED Notes (Signed)
Patient reports over past year has been going through divorce. Lost 60 pounds over past year. Has nausea/vomiting/diarrhea intermittently. Denies pain. PCP concerned about dehydration, sent here for help. Pt admits to depression and anxiety.

## 2014-09-05 NOTE — ED Provider Notes (Signed)
[facility] Emergency Department Provider Note  Time seen: 1:21 PM  I have reviewed the triage vital signs and the nursing notes.   HISTORY  Chief Complaint Weight Loss    HPI Ricky Rangel is a 74 y.o. male with a past medical history of neck cancer, presents the emergency department with weakness, and weight loss of 60 pounds over one year. Patient's daughter is in town visiting, and made the patient come to the emergency department for evaluation for his weight loss symptoms.Patient had a past history of neck cancer, but recently had a PET scan less than 1 month ago showing no recurrence of disease. Per the daughter the patient has been going through a divorce, and states he has been depressed and not eating or drinking much. Triage note states the patient has been having diarrhea, the patient specifically denies this. Patient denies any chest pain, abdominal pain. Does state occasional nausea but denies any vomiting.     No past medical history on file.  There are no active problems to display for this patient.   No past surgical history on file.  No current outpatient prescriptions on file.  Allergies Review of patient's allergies indicates not on file.  No family history on file.  Social History History  Substance Use Topics  . Smoking status: Not on file  . Smokeless tobacco: Not on file  . Alcohol Use: Not on file    Review of Systems Constitutional: Negative for fever. Positive for 60 pound weight loss over one year. Cardiovascular: Negative for chest pain. Respiratory: Negative for shortness of breath. Gastrointestinal: Negative for abdominal pain Genitourinary: Negative for dysuria. Musculoskeletal: Negative for back pain. 10-point ROS otherwise negative.  ____________________________________________   PHYSICAL EXAM:  VITAL SIGNS: ED Triage Vitals  Enc Vitals Group     BP 09/05/14 1213 137/84 mmHg     Pulse Rate 09/05/14  1213 109     Resp 09/05/14 1213 18     Temp 09/05/14 1213 98.8 F (37.1 C)     Temp Source 09/05/14 1213 Oral     SpO2 09/05/14 1213 100 %     Weight 09/05/14 1213 139 lb (63.05 kg)     Height 09/05/14 1213 5\' 7"  (1.702 m)     Head Cir --      Peak Flow --      Pain Score --      Pain Loc --      Pain Edu? --      Excl. in [place]? --     Constitutional: Alert and oriented. Well appearing and in no distress. Eyes: Normal exam ENT   Mouth/Throat: Mucous membranes are moist. No mass palpated in the mouth or neck. Cardiovascular: Normal rate, regular rhythm. No murmur Respiratory: Normal respiratory effort without tachypnea nor retractions. Breath sounds are clear and equal bilaterally. No wheezes/rales/rhonchi.  Gastrointestinal: Soft and nontender. No distention.  There is no CVA tenderness. Musculoskeletal: Nontender with normal range of motion in all extremities. Neurologic:  Normal speech and language. No gross focal neurologic deficits Skin:  Skin is warm, dry and intact.  Psychiatric: Mood and affect are normal. Speech and behavior are normal.  ____________________________________________    EKG  EKG reviewed and interpreted by myself shows normal sinus rhythm at 104 bpm, narrow QRS, normal axis, nonspecific ST changes present.  ____________________________________________    INITIAL IMPRESSION / ASSESSMENT AND PLAN / ED COURSE  Pertinent labs & imaging results that were available during my  care of the patient were reviewed by me and considered in my medical decision making (see chart for details).  Patient with 60 pound weight loss. I've reviewed the patient's recent PET scan at did not show any active areas. Given his reported depression due to a recent divorce patient's weight loss is likely due to to diet changes. We will check labs, IV hydrate, and closely monitor in the emergency department. Patient has a primary care doctor which they have a follow-up appointment  scheduled with. No SI or HI at this time.  Labs are largely within normal limits, slightly low sodium. I discussed with the patient need to increase his diet including salt intake. I reviewed the patient's recent head scan, did not appear to show any active areas. I believe the patient's weight loss is likely due to depression, decreased appetite. I discussed with the patient that he would benefit from speaking to his psychiatrist, the patient does not want to speak to a psychiatrist. He denies any SI or HI. I discussed with the patient seeing his primary care doctor to discuss his recent depression, and possibly starting an SSRI, and the patient and daughter are agreeable to this and will plan to see the primary care doctor this coming week. We'll discharge patient home.  ____________________________________________   FINAL CLINICAL IMPRESSION(S) / ED DIAGNOSES  Weight loss Depression   Harvest Dark, MD 09/05/14 1515

## 2014-09-05 NOTE — Discharge Instructions (Signed)
As we have discussed please increase your dietary intake, as well as her intake of salt of the next several weeks. Please follow-up with your primary care doctor this coming week for reevaluation and to discuss further any depression concerned you may have. Return to the emergency department for any further concerns.   Fatigue Fatigue is a feeling of tiredness, lack of energy, lack of motivation, or feeling tired all the time. Having enough rest, good nutrition, and reducing stress will normally reduce fatigue. Consult your caregiver if it persists. The nature of your fatigue will help your caregiver to find out its cause. The treatment is based on the cause.  CAUSES  There are many causes for fatigue. Most of the time, fatigue can be traced to one or more of your habits or routines. Most causes fit into one or more of three general areas. They are: Lifestyle problems  Sleep disturbances.  Overwork.  Physical exertion.  Unhealthy habits.  Poor eating habits or eating disorders.  Alcohol and/or drug use .  Lack of proper nutrition (malnutrition). Psychological problems  Stress and/or anxiety problems.  Depression.  Grief.  Boredom. Medical Problems or Conditions  Anemia.  Pregnancy.  Thyroid gland problems.  Recovery from major surgery.  Continuous pain.  Emphysema or asthma that is not well controlled  Allergic conditions.  Diabetes.  Infections (such as mononucleosis).  Obesity.  Sleep disorders, such as sleep apnea.  Heart failure or other heart-related problems.  Cancer.  Kidney disease.  Liver disease.  Effects of certain medicines such as antihistamines, cough and cold remedies, prescription pain medicines, heart and blood pressure medicines, drugs used for treatment of cancer, and some antidepressants. SYMPTOMS  The symptoms of fatigue include:   Lack of energy.  Lack of drive (motivation).  Drowsiness.  Feeling of indifference to the  surroundings. DIAGNOSIS  The details of how you feel help guide your caregiver in finding out what is causing the fatigue. You will be asked about your present and past health condition. It is important to review all medicines that you take, including prescription and non-prescription items. A thorough exam will be done. You will be questioned about your feelings, habits, and normal lifestyle. Your caregiver may suggest blood tests, urine tests, or other tests to look for common medical causes of fatigue.  TREATMENT  Fatigue is treated by correcting the underlying cause. For example, if you have continuous pain or depression, treating these causes will improve how you feel. Similarly, adjusting the dose of certain medicines will help in reducing fatigue.  HOME CARE INSTRUCTIONS   Try to get the required amount of good sleep every night.  Eat a healthy and nutritious diet, and drink enough water throughout the day.  Practice ways of relaxing (including yoga or meditation).  Exercise regularly.  Make plans to change situations that cause stress. Act on those plans so that stresses decrease over time. Keep your work and personal routine reasonable.  Avoid street drugs and minimize use of alcohol.  Start taking a daily multivitamin after consulting your caregiver. SEEK MEDICAL CARE IF:   You have persistent tiredness, which cannot be accounted for.  You have fever.  You have unintentional weight loss.  You have headaches.  You have disturbed sleep throughout the night.  You are feeling sad.  You have constipation.  You have dry skin.  You have gained weight.  You are taking any new or different medicines that you suspect are causing fatigue.  You are unable  to sleep at night.  You develop any unusual swelling of your legs or other parts of your body. SEEK IMMEDIATE MEDICAL CARE IF:   You are feeling confused.  Your vision is blurred.  You feel faint or pass out.  You  develop severe headache.  You develop severe abdominal, pelvic, or back pain.  You develop chest pain, shortness of breath, or an irregular or fast heartbeat.  You are unable to pass a normal amount of urine.  You develop abnormal bleeding such as bleeding from the rectum or you vomit blood.  You have thoughts about harming yourself or committing suicide.  You are worried that you might harm someone else. MAKE SURE YOU:   Understand these instructions.  Will watch your condition.  Will get help right away if you are not doing well or get worse. Document Released: 11/22/2006 Document Revised: 04/19/2011 Document Reviewed: 05/29/2013 [facility] Patient Information 2015 [place], [place]. This information is not intended to replace advice given to you by your health care provider. Make sure you discuss any questions you have with your health care provider.

## 2014-09-05 NOTE — ED Notes (Signed)
Pt given diet coke to drink. No further needs. Daughter at bedside.

## 2014-09-06 ENCOUNTER — Other Ambulatory Visit: Payer: Self-pay

## 2014-09-06 DIAGNOSIS — F329 Major depressive disorder, single episode, unspecified: Secondary | ICD-10-CM

## 2014-09-06 DIAGNOSIS — F32A Depression, unspecified: Secondary | ICD-10-CM

## 2014-09-06 MED ORDER — PAROXETINE HCL 10 MG PO TABS: 10.0000 mg | ORAL_TABLET | Freq: Every day | ORAL | Status: DC

## 2014-09-10 ENCOUNTER — Ambulatory Visit (INDEPENDENT_AMBULATORY_CARE_PROVIDER_SITE_OTHER): Payer: Medicare Other | Admitting: Family Medicine

## 2014-09-10 ENCOUNTER — Encounter: Payer: Self-pay | Admitting: Family Medicine

## 2014-09-10 VITALS — BP 100/80 | HR 70 | Ht 67.0 in | Wt 135.0 lb

## 2014-09-10 DIAGNOSIS — R5382 Chronic fatigue, unspecified: Secondary | ICD-10-CM | POA: Diagnosis not present

## 2014-09-10 DIAGNOSIS — C029 Malignant neoplasm of tongue, unspecified: Secondary | ICD-10-CM | POA: Diagnosis not present

## 2014-09-10 DIAGNOSIS — F32A Depression, unspecified: Secondary | ICD-10-CM

## 2014-09-10 DIAGNOSIS — F329 Major depressive disorder, single episode, unspecified: Secondary | ICD-10-CM | POA: Diagnosis not present

## 2014-09-10 DIAGNOSIS — R634 Abnormal weight loss: Secondary | ICD-10-CM | POA: Diagnosis not present

## 2014-09-10 MED ORDER — PAROXETINE HCL 10 MG PO TABS: 10.0000 mg | ORAL_TABLET | Freq: Every day | ORAL | Status: AC

## 2014-09-10 NOTE — Progress Notes (Signed)
Name: Ricky Rangel   MRN: [id number]    DOB: 1940/03/29   Date:09/10/2014       Progress Note  Subjective  Chief Complaint  Chief Complaint  Patient presents with  . Follow-up    anxiety- started Paxil    Mental Health Problem The primary symptoms include dysphoric mood. The primary symptoms do not include delusions, hallucinations, bizarre behavior, disorganized speech, negative symptoms or somatic symptoms. The current episode started more than 1 month ago. This is a new problem.  The onset of the illness is precipitated by a stressful event. The degree of incapacity that he is experiencing as a consequence of his illness is mild. Additional symptoms of the illness include hypersomnia, appetite change, unexpected weight change and fatigue. Additional symptoms of the illness do not include no anhedonia, no insomnia, no agitation, no headaches or no abdominal pain. He does not admit to suicidal ideas. He does not have a plan to commit suicide. He does not contemplate harming himself. He has not already injured self. He does not contemplate injuring another person. He has not already  injured another person.    No problem-specific assessment & plan notes found for this encounter.   No past medical history on file.  No past surgical history on file.  No family history on file.  History   Social History  . Marital Status: Unknown    Spouse Name: N/A  . Number of Children: N/A  . Years of Education: N/A   Occupational History  . Not on file.   Social History Main Topics  . Smoking status: Never Smoker   . Smokeless tobacco: Never Used  . Alcohol Use: No  . Drug Use: Not on file  . Sexual Activity: Not on file   Other Topics Concern  . Not on file   Social History Narrative    Allergies  Allergen Reactions  . No Known Allergies      Review of Systems  Constitutional: Positive for appetite change, fatigue and unexpected weight change. Negative for fever, chills,  weight loss and malaise/fatigue.  HENT: Negative for ear discharge, ear pain and sore throat.   Eyes: Negative for blurred vision.  Respiratory: Negative for cough, sputum production, shortness of breath and wheezing.   Cardiovascular: Negative for chest pain, palpitations and leg swelling.  Gastrointestinal: Negative for heartburn, nausea, abdominal pain, diarrhea, constipation, blood in stool and melena.  Genitourinary: Negative for dysuria, urgency, frequency and hematuria.  Musculoskeletal: Negative for myalgias, back pain, joint pain and neck pain.  Skin: Negative for rash.  Neurological: Negative for dizziness, tingling, sensory change, focal weakness and headaches.  Endo/Heme/Allergies: Negative for environmental allergies and polydipsia. Does not bruise/bleed easily.  Psychiatric/Behavioral: Positive for dysphoric mood. Negative for depression, suicidal ideas, hallucinations and agitation. The patient is not nervous/anxious and does not have insomnia.      Objective  Filed Vitals:   09/10/14 1006  BP: 100/80  Pulse: 70  Height: 5\' 7"  (1.702 m)  Weight: 135 lb (61.236 kg)    Physical Exam  Constitutional: He is oriented to person, place, and time and well-developed, well-nourished, and in no distress.  HENT:  Head: Normocephalic.  Right Ear: External ear normal.  Left Ear: External ear normal.  Nose: Nose normal.  Mouth/Throat: Oropharynx is clear and moist.  Eyes: Conjunctivae and EOM are normal. Pupils are equal, round, and reactive to light. Right eye exhibits no discharge. Left eye exhibits no discharge. No scleral icterus.  Neck: Normal  range of motion. Neck supple. No JVD present. No tracheal deviation present. No thyromegaly present.  Cardiovascular: Normal rate, regular rhythm, normal heart sounds and intact distal pulses.  Exam reveals no gallop and no friction rub.   No murmur heard. Pulmonary/Chest: Breath sounds normal. No respiratory distress. He has no  wheezes. He has no rales.  Abdominal: Soft. Bowel sounds are normal. He exhibits no mass. There is no hepatosplenomegaly. There is no tenderness. There is no rebound, no guarding and no CVA tenderness.  Musculoskeletal: Normal range of motion. He exhibits no edema or tenderness.  Lymphadenopathy:    He has no cervical adenopathy.  Neurological: He is alert and oriented to person, place, and time. He has normal sensation, normal strength, normal reflexes and intact cranial nerves. No cranial nerve deficit.  Skin: Skin is warm. No rash noted.  Psychiatric: Mood and affect normal.      Assessment & Plan  Problem List Items Addressed This Visit      Digestive   Carcinoma of tongue    Other Visit Diagnoses    Depression    -  Primary    Relevant Medications    PARoxetine (PAXIL) 10 MG tablet    Weight loss        Chronic fatigue        Relevant Orders    B12         Dr. [facility]  09/10/2014

## 2014-09-11 LAB — VITAMIN B12: Vitamin B-12: >2000 pg/mL — ABNORMAL HIGH (ref 211–946)

## 2014-09-12 ENCOUNTER — Other Ambulatory Visit: Payer: Self-pay

## 2014-09-12 ENCOUNTER — Other Ambulatory Visit: Payer: Self-pay | Admitting: Family Medicine

## 2014-09-12 DIAGNOSIS — R42 Dizziness and giddiness: Secondary | ICD-10-CM

## 2014-09-12 DIAGNOSIS — E785 Hyperlipidemia, unspecified: Secondary | ICD-10-CM

## 2014-09-12 MED ORDER — MECLIZINE HCL 25 MG PO TABS: 25.0000 mg | ORAL_TABLET | Freq: Three times a day (TID) | ORAL | Status: DC | PRN

## 2014-09-13 ENCOUNTER — Other Ambulatory Visit: Payer: Self-pay | Admitting: Family Medicine

## 2014-09-13 DIAGNOSIS — E785 Hyperlipidemia, unspecified: Secondary | ICD-10-CM

## 2014-09-17 ENCOUNTER — Other Ambulatory Visit: Payer: Self-pay | Admitting: Family Medicine

## 2014-10-25 ENCOUNTER — Ambulatory Visit: Payer: Self-pay | Admitting: Family Medicine

## 2014-10-31 ENCOUNTER — Other Ambulatory Visit: Payer: Self-pay | Admitting: Family Medicine

## 2014-10-31 DIAGNOSIS — K219 Gastro-esophageal reflux disease without esophagitis: Secondary | ICD-10-CM

## 2014-11-01 ENCOUNTER — Other Ambulatory Visit: Payer: Self-pay

## 2014-11-01 ENCOUNTER — Other Ambulatory Visit: Payer: Self-pay | Admitting: Family Medicine

## 2014-11-08 ENCOUNTER — Inpatient Hospital Stay: Payer: Medicare Other | Admitting: Anesthesiology

## 2014-11-08 ENCOUNTER — Emergency Department: Payer: Medicare Other

## 2014-11-08 ENCOUNTER — Encounter: Payer: Self-pay | Admitting: Internal Medicine

## 2014-11-08 ENCOUNTER — Encounter
Admission: EM | Disposition: A | Discharge status: Home / Self Care | Payer: Self-pay | Source: Home / Self Care | Attending: Internal Medicine

## 2014-11-08 ENCOUNTER — Inpatient Hospital Stay
Admission: EM | Admit: 2014-11-08 | Discharge: 2014-11-12 | DRG: 377 | Disposition: A | Discharge status: Home / Self Care | Payer: Medicare Other | Attending: Internal Medicine | Admitting: Internal Medicine

## 2014-11-08 DIAGNOSIS — Z8673 Personal history of transient ischemic attack (TIA), and cerebral infarction without residual deficits: Secondary | ICD-10-CM | POA: Diagnosis not present

## 2014-11-08 DIAGNOSIS — E1122 Type 2 diabetes mellitus with diabetic chronic kidney disease: Secondary | ICD-10-CM | POA: Diagnosis present

## 2014-11-08 DIAGNOSIS — K31811 Angiodysplasia of stomach and duodenum with bleeding: Secondary | ICD-10-CM | POA: Diagnosis present

## 2014-11-08 DIAGNOSIS — Z823 Family history of stroke: Secondary | ICD-10-CM

## 2014-11-08 DIAGNOSIS — Z7982 Long term (current) use of aspirin: Secondary | ICD-10-CM | POA: Diagnosis not present

## 2014-11-08 DIAGNOSIS — I638 Other cerebral infarction: Secondary | ICD-10-CM | POA: Diagnosis not present

## 2014-11-08 DIAGNOSIS — Z8249 Family history of ischemic heart disease and other diseases of the circulatory system: Secondary | ICD-10-CM | POA: Diagnosis not present

## 2014-11-08 DIAGNOSIS — E785 Hyperlipidemia, unspecified: Secondary | ICD-10-CM | POA: Diagnosis not present

## 2014-11-08 DIAGNOSIS — N179 Acute kidney failure, unspecified: Secondary | ICD-10-CM

## 2014-11-08 DIAGNOSIS — I2510 Atherosclerotic heart disease of native coronary artery without angina pectoris: Secondary | ICD-10-CM | POA: Diagnosis not present

## 2014-11-08 DIAGNOSIS — Z951 Presence of aortocoronary bypass graft: Secondary | ICD-10-CM

## 2014-11-08 DIAGNOSIS — K922 Gastrointestinal hemorrhage, unspecified: Secondary | ICD-10-CM | POA: Diagnosis not present

## 2014-11-08 DIAGNOSIS — F17200 Nicotine dependence, unspecified, uncomplicated: Secondary | ICD-10-CM | POA: Diagnosis not present

## 2014-11-08 DIAGNOSIS — K209 Esophagitis, unspecified without bleeding: Secondary | ICD-10-CM | POA: Insufficient documentation

## 2014-11-08 DIAGNOSIS — Z79899 Other long term (current) drug therapy: Secondary | ICD-10-CM

## 2014-11-08 DIAGNOSIS — K2210 Ulcer of esophagus without bleeding: Secondary | ICD-10-CM | POA: Diagnosis present

## 2014-11-08 DIAGNOSIS — K279 Peptic ulcer, site unspecified, unspecified as acute or chronic, without hemorrhage or perforation: Secondary | ICD-10-CM | POA: Diagnosis present

## 2014-11-08 DIAGNOSIS — N170 Acute kidney failure with tubular necrosis: Secondary | ICD-10-CM | POA: Diagnosis present

## 2014-11-08 DIAGNOSIS — Z9889 Other specified postprocedural states: Secondary | ICD-10-CM

## 2014-11-08 DIAGNOSIS — Q2730 Arteriovenous malformation, site unspecified: Secondary | ICD-10-CM | POA: Diagnosis not present

## 2014-11-08 DIAGNOSIS — K259 Gastric ulcer, unspecified as acute or chronic, without hemorrhage or perforation: Secondary | ICD-10-CM | POA: Diagnosis not present

## 2014-11-08 DIAGNOSIS — Z6820 Body mass index (BMI) 20.0-20.9, adult: Secondary | ICD-10-CM | POA: Diagnosis not present

## 2014-11-08 DIAGNOSIS — E875 Hyperkalemia: Secondary | ICD-10-CM | POA: Diagnosis present

## 2014-11-08 DIAGNOSIS — K210 Gastro-esophageal reflux disease with esophagitis: Secondary | ICD-10-CM | POA: Diagnosis present

## 2014-11-08 DIAGNOSIS — K253 Acute gastric ulcer without hemorrhage or perforation: Secondary | ICD-10-CM | POA: Diagnosis not present

## 2014-11-08 DIAGNOSIS — D62 Acute posthemorrhagic anemia: Secondary | ICD-10-CM | POA: Diagnosis not present

## 2014-11-08 DIAGNOSIS — N19 Unspecified kidney failure: Secondary | ICD-10-CM | POA: Diagnosis not present

## 2014-11-08 DIAGNOSIS — E440 Moderate protein-calorie malnutrition: Secondary | ICD-10-CM | POA: Insufficient documentation

## 2014-11-08 DIAGNOSIS — N189 Chronic kidney disease, unspecified: Secondary | ICD-10-CM | POA: Diagnosis present

## 2014-11-08 DIAGNOSIS — K254 Chronic or unspecified gastric ulcer with hemorrhage: Secondary | ICD-10-CM | POA: Diagnosis not present

## 2014-11-08 DIAGNOSIS — K2980 Duodenitis without bleeding: Secondary | ICD-10-CM | POA: Diagnosis not present

## 2014-11-08 DIAGNOSIS — I959 Hypotension, unspecified: Secondary | ICD-10-CM

## 2014-11-08 DIAGNOSIS — K920 Hematemesis: Secondary | ICD-10-CM | POA: Diagnosis not present

## 2014-11-08 DIAGNOSIS — I214 Non-ST elevation (NSTEMI) myocardial infarction: Secondary | ICD-10-CM | POA: Diagnosis not present

## 2014-11-08 DIAGNOSIS — E1151 Type 2 diabetes mellitus with diabetic peripheral angiopathy without gangrene: Secondary | ICD-10-CM | POA: Diagnosis not present

## 2014-11-08 DIAGNOSIS — I252 Old myocardial infarction: Secondary | ICD-10-CM

## 2014-11-08 DIAGNOSIS — I10 Essential (primary) hypertension: Secondary | ICD-10-CM | POA: Diagnosis not present

## 2014-11-08 DIAGNOSIS — D649 Anemia, unspecified: Secondary | ICD-10-CM | POA: Diagnosis not present

## 2014-11-08 DIAGNOSIS — Z955 Presence of coronary angioplasty implant and graft: Secondary | ICD-10-CM

## 2014-11-08 DIAGNOSIS — N171 Acute kidney failure with acute cortical necrosis: Secondary | ICD-10-CM | POA: Diagnosis not present

## 2014-11-08 DIAGNOSIS — I129 Hypertensive chronic kidney disease with stage 1 through stage 4 chronic kidney disease, or unspecified chronic kidney disease: Secondary | ICD-10-CM | POA: Diagnosis present

## 2014-11-08 DIAGNOSIS — R42 Dizziness and giddiness: Secondary | ICD-10-CM | POA: Diagnosis not present

## 2014-11-08 HISTORY — DX: Transient cerebral ischemic attack, unspecified: G45.9

## 2014-11-08 HISTORY — DX: Type 2 diabetes mellitus without complications: E11.9

## 2014-11-08 HISTORY — PX: ESOPHAGOGASTRODUODENOSCOPY (EGD) WITH PROPOFOL: SHX5813

## 2014-11-08 HISTORY — DX: Essential (primary) hypertension: I10

## 2014-11-08 HISTORY — DX: Peripheral vascular disease, unspecified: I73.9

## 2014-11-08 HISTORY — DX: Acute myocardial infarction, unspecified: I21.9

## 2014-11-08 HISTORY — DX: Atherosclerotic heart disease of native coronary artery without angina pectoris: I25.10

## 2014-11-08 HISTORY — DX: Cerebral infarction, unspecified: I63.9

## 2014-11-08 LAB — HEMOGLOBIN
HEMOGLOBIN: 10.2 g/dL — AB (ref 13.0–18.0)
Hemoglobin: 10 g/dL — ABNORMAL LOW (ref 13.0–18.0)

## 2014-11-08 LAB — COMPREHENSIVE METABOLIC PANEL
ALBUMIN: 3.2 g/dL — AB (ref 3.5–5.0)
ALK PHOS: 47 U/L (ref 38–126)
ALT: 15 U/L — ABNORMAL LOW (ref 17–63)
AST: 19 U/L (ref 15–41)
Anion gap: 9 (ref 5–15)
BILIRUBIN TOTAL: 0.6 mg/dL (ref 0.3–1.2)
BUN: 143 mg/dL — AB (ref 6–20)
CALCIUM: 8.6 mg/dL — AB (ref 8.9–10.3)
CO2: 18 mmol/L — ABNORMAL LOW (ref 22–32)
Chloride: 105 mmol/L (ref 101–111)
Creatinine, Ser: <0.3 mg/dL — ABNORMAL LOW (ref 0.61–1.24)
GLUCOSE: 177 mg/dL — AB (ref 65–99)
Potassium: 6 mmol/L — ABNORMAL HIGH (ref 3.5–5.1)
Sodium: 132 mmol/L — ABNORMAL LOW (ref 135–145)
TOTAL PROTEIN: 5.8 g/dL — AB (ref 6.5–8.1)

## 2014-11-08 LAB — CBC WITH DIFFERENTIAL/PLATELET
BASOS ABS: 0 10*3/uL (ref 0–0.1)
BASOS PCT: 0 %
EOS ABS: 0.1 10*3/uL (ref 0–0.7)
EOS PCT: 1 %
HCT: 28.7 % — ABNORMAL LOW (ref 40.0–52.0)
Hemoglobin: 9.8 g/dL — ABNORMAL LOW (ref 13.0–18.0)
Lymphocytes Relative: 11 %
Lymphs Abs: 1.1 10*3/uL (ref 1.0–3.6)
MCH: 32.2 pg (ref 26.0–34.0)
MCHC: 34 g/dL (ref 32.0–36.0)
MCV: 94.6 fL (ref 80.0–100.0)
MONO ABS: 0.7 10*3/uL (ref 0.2–1.0)
Monocytes Relative: 7 %
Neutro Abs: 8.4 10*3/uL — ABNORMAL HIGH (ref 1.4–6.5)
Neutrophils Relative %: 81 %
PLATELETS: 204 10*3/uL (ref 150–440)
RBC: 3.04 MIL/uL — AB (ref 4.40–5.90)
RDW: 13.4 % (ref 11.5–14.5)
WBC: 10.2 10*3/uL (ref 3.8–10.6)

## 2014-11-08 LAB — ABO/RH: ABO/RH(D): A POS

## 2014-11-08 LAB — PREPARE RBC (CROSSMATCH)

## 2014-11-08 LAB — PROTIME-INR
INR: 1.2
PROTHROMBIN TIME: 15.4 s — AB (ref 11.4–15.0)

## 2014-11-08 LAB — MRSA PCR SCREENING: MRSA by PCR: NEGATIVE

## 2014-11-08 LAB — APTT: APTT: 26 s (ref 24–36)

## 2014-11-08 LAB — TROPONIN I: Troponin I: 0.04 ng/mL — ABNORMAL HIGH (ref <0.031)

## 2014-11-08 LAB — LIPASE, BLOOD: LIPASE: 59 U/L — AB (ref 22–51)

## 2014-11-08 SURGERY — ESOPHAGOGASTRODUODENOSCOPY (EGD) WITH PROPOFOL
Anesthesia: General

## 2014-11-08 MED ORDER — ONDANSETRON HCL 4 MG/2ML IJ SOLN
4.0000 mg | Freq: Four times a day (QID) | INTRAMUSCULAR | Status: DC | PRN
  Administered 2014-11-08: 4 mg via INTRAVENOUS

## 2014-11-08 MED ORDER — OCTREOTIDE ACETATE 500 MCG/ML IJ SOLN
50.0000 ug/h | INTRAMUSCULAR | Status: DC
  Administered 2014-11-08 – 2014-11-09 (×3): 50 ug/h via INTRAVENOUS
  Filled 2014-11-08 (×7): qty 1

## 2014-11-08 MED ORDER — OCTREOTIDE LOAD VIA INFUSION
50.0000 ug | Freq: Once | INTRAVENOUS | Status: AC
  Administered 2014-11-08: 50 ug via INTRAVENOUS
  Filled 2014-11-08: qty 25

## 2014-11-08 MED ORDER — SODIUM CHLORIDE 0.9 % IV SOLN
8.0000 mg/h | INTRAVENOUS | Status: DC
  Filled 2014-11-08: qty 80

## 2014-11-08 MED ORDER — PROPOFOL 10 MG/ML IV BOLUS
INTRAVENOUS | Status: DC | PRN
  Administered 2014-11-08: 150 mg via INTRAVENOUS

## 2014-11-08 MED ORDER — ACETAMINOPHEN 325 MG PO TABS
650.0000 mg | ORAL_TABLET | Freq: Four times a day (QID) | ORAL | Status: DC | PRN
  Administered 2014-11-11: 650 mg via ORAL
  Filled 2014-11-08: qty 2

## 2014-11-08 MED ORDER — SODIUM CHLORIDE 0.9 % IV SOLN
80.0000 mg | Freq: Once | INTRAVENOUS | Status: DC
  Filled 2014-11-08: qty 80

## 2014-11-08 MED ORDER — PHENYLEPHRINE HCL 10 MG/ML IJ SOLN
INTRAMUSCULAR | Status: DC | PRN
  Administered 2014-11-08 (×7): 100 ug via INTRAVENOUS

## 2014-11-08 MED ORDER — SODIUM CHLORIDE 0.9 % IV SOLN
INTRAVENOUS | Status: DC
  Administered 2014-11-09: 10:00:00 via INTRAVENOUS

## 2014-11-08 MED ORDER — SODIUM CHLORIDE 0.9 % IV SOLN
8.0000 mg/h | INTRAVENOUS | Status: DC
  Administered 2014-11-08: 8 mg/h via INTRAVENOUS
  Filled 2014-11-08: qty 80

## 2014-11-08 MED ORDER — SODIUM CHLORIDE 0.9 % IV SOLN
1000.0000 mL | Freq: Once | INTRAVENOUS | Status: AC
  Administered 2014-11-08: 18:00:00 via INTRAVENOUS

## 2014-11-08 MED ORDER — ONDANSETRON HCL 4 MG PO TABS: 4.0000 mg | ORAL_TABLET | Freq: Four times a day (QID) | ORAL | Status: DC | PRN

## 2014-11-08 MED ORDER — SODIUM CHLORIDE 0.9 % IV SOLN
25.0000 ug/h | INTRAVENOUS | Status: DC
  Administered 2014-11-09 – 2014-11-10 (×2): 50 ug/h via INTRAVENOUS
  Filled 2014-11-08 (×7): qty 1

## 2014-11-08 MED ORDER — PANTOPRAZOLE SODIUM 40 MG IV SOLR
INTRAVENOUS | Status: AC
  Administered 2014-11-08: 80 mg
  Filled 2014-11-08: qty 80

## 2014-11-08 MED ORDER — ACETAMINOPHEN 650 MG RE SUPP: 650.0000 mg | Freq: Four times a day (QID) | RECTAL | Status: DC | PRN

## 2014-11-08 MED ORDER — SODIUM CHLORIDE 0.9 % IV SOLN: 80.0000 mg | Freq: Once | INTRAVENOUS | Status: DC

## 2014-11-08 MED ORDER — SUCCINYLCHOLINE CHLORIDE 20 MG/ML IJ SOLN
INTRAMUSCULAR | Status: DC | PRN
  Administered 2014-11-08: 100 mg via INTRAVENOUS

## 2014-11-08 MED ORDER — SODIUM CHLORIDE 0.9 % IV SOLN
8.0000 mg/h | INTRAVENOUS | Status: DC
  Administered 2014-11-09 – 2014-11-10 (×4): 8 mg/h via INTRAVENOUS
  Filled 2014-11-08 (×3): qty 80

## 2014-11-08 MED ORDER — ONDANSETRON HCL 4 MG/2ML IJ SOLN
INTRAMUSCULAR | Status: AC
  Administered 2014-11-08: 4 mg
  Filled 2014-11-08: qty 2

## 2014-11-08 MED ORDER — SODIUM CHLORIDE 0.9 % IV SOLN: 10.0000 mL/h | Freq: Once | INTRAVENOUS | Status: DC

## 2014-11-08 MED ORDER — SODIUM CHLORIDE 0.9 % IJ SOLN
PREFILLED_SYRINGE | INTRAMUSCULAR | Status: DC | PRN
  Administered 2014-11-08: 1.5 mL

## 2014-11-08 NOTE — Anesthesia Procedure Notes (Signed)
Procedure Name: Intubation Performed by: Sinda Du Pre-anesthesia Checklist: Patient identified, Patient being monitored, Timeout performed, Emergency Drugs available and Suction available Patient Re-evaluated:Patient Re-evaluated prior to inductionOxygen Delivery Method: Circle system utilized Preoxygenation: Pre-oxygenation with 100% oxygen Intubation Type: IV induction, Rapid sequence and Cricoid Pressure applied Ventilation: Mask ventilation without difficulty Laryngoscope Size: Mac and 4 Grade View: Grade I Tube type: Oral Tube size: 7.0 mm Number of attempts: 1 Airway Equipment and Method: Stylet Placement Confirmation: ETT inserted through vocal cords under direct vision,  positive ETCO2 and breath sounds checked- equal and bilateral Secured at: 21 cm Tube secured with: Tape Dental Injury: Teeth and Oropharynx as per pre-operative assessment

## 2014-11-08 NOTE — Transfer of Care (Signed)
Immediate Anesthesia Transfer of Care Note  Patient: Ricky Rangel  Procedure(s) Performed: Procedure(s): ESOPHAGOGASTRODUODENOSCOPY (EGD) WITH PROPOFOL (N/A)  Patient Location: PACU  Anesthesia Type:General  Level of Consciousness: awake and oriented  Airway & Oxygen Therapy: Patient Spontanous Breathing and Patient connected to face mask oxygen  Post-op Assessment: Report given to RN and Post -op Vital signs reviewed and stable  Post vital signs: Reviewed  Last Vitals:  Filed Vitals:   11/08/14 1727  BP: 114/78  Pulse: 90  Temp: 36.4 C  Resp: 301    Complications: No apparent anesthesia complications

## 2014-11-08 NOTE — Op Note (Signed)
[facility] Gastroenterology Patient Name: Ricky Rangel Procedure Date: 11/08/2014 5:36 PM MRN: [id number] Account #: [id number] Date of Birth: November 17, 1940 Admit Type: Inpatient Age: 74 Room: [facility] ENDO ROOM 3 Gender: Male Note Status: Finalized Procedure:         Upper GI endoscopy Indications:       Coffee-ground emesis Providers:         Lucilla Lame, MD Medicines:         Propofol per Anesthesia, General Anesthesia Complications:     No immediate complications. Procedure:         Pre-Anesthesia Assessment:                    - Prior to the procedure, a History and Physical was                     performed, and patient medications and allergies were                     reviewed. The patient's tolerance of previous anesthesia                     was also reviewed. The risks and benefits of the procedure                     and the sedation options and risks were discussed with the                     patient. All questions were answered, and informed consent                     was obtained. Prior Anticoagulants: The patient has taken                     no previous anticoagulant or antiplatelet agents. ASA                     Grade Assessment: IV - A patient with severe systemic                     disease that is a constant threat to life. After reviewing                     the risks and benefits, the patient was deemed in                     satisfactory condition to undergo the procedure.                    After obtaining informed consent, the endoscope was passed                     under direct vision. Throughout the procedure, the                     patient's blood pressure, pulse, and oxygen saturations                     were monitored continuously. The Endoscope was introduced                     through the mouth, and advanced to the second part of  duodenum. The upper GI endoscopy was accomplished without   difficulty. The patient tolerated the procedure well. Findings:      LA Grade A (one or more mucosal breaks less than 5 mm, not extending       between tops of 2 mucosal folds) esophagitis with no bleeding was found       in the middle third of the esophagus.      One oozing cratered gastric ulcer with pigmented material was found in       the cardia. Area was successfully injected with 3 mL of a 1:10,000       solution of epinephrine for hemostasis. Coagulation for hemostasis using       argon beam at 2 liters/minute and 20 watts was successful.      Multiple small angiodysplastic lesions with stigmata of recent bleeding       were found in the entire examined stomach. Coagulation for hemostasis       using argon plasma at 2 liters/minute and 25 watts was successful.      The examined duodenum was normal. Impression:        - LA Grade A esophagitis.                    - Gastric ulcer. Injected. Treated with argon beam                     coagulation.                    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 Treated with argon plasma coagulation (APC).                    - Normal examined duodenum.                    - No specimens collected. Recommendation:    - Follow Hb. Procedure Code(s): --- Professional ---                    [phone], Esophagogastroduodenoscopy, flexible, transoral;                     with control of bleeding, any method Diagnosis Code(s): --- Professional ---                    K92.0, Hematemesis                    K20.9, Esophagitis, unspecified                    K25.9, Gastric ulcer, unspecified as acute or chronic,                     without hemorrhage or perforation                    K31.811, Angiodysplasia of stomach and duodenum with                     bleeding CPT copyright 2014 American Medical Association. All rights reserved. The codes documented in this report are preliminary and upon coder review  may  be revised to meet current compliance requirements. Lucilla Lame, MD 11/08/2014 6:15:13 PM This report has been signed  electronically. Number of Addenda: 0 Note Initiated On: 11/08/2014 5:36 PM      [facility]

## 2014-11-08 NOTE — H&P (Signed)
[place] [facility] [place] NAME: Ricky Rangel    MR#:  [id number]  DATE OF BIRTH:  12/09/1940  DATE OF ADMISSION:  11/08/2014  PRIMARY CARE PHYSICIAN: Otilio Miu, MD   REQUESTING/REFERRING PHYSICIAN:  Ocie Bob M.D.  CHIEF COMPLAINT:   Chief Complaint  Patient presents with  . GI Bleeding    HISTORY OF PRESENT ILLNESS: Ricky Rangel  is a 74 y.o. male with a known history of  coronary artery disease with history of multiple stents, peripheral vascular disease and history of CVA who is X wife went to visit him to check on him and the cat. She states that over the past year he's lost a lot of weight and has had failure to thrive. When she went to visit him today she noticed that he had bright red blood around his mouth and clots in his mouth. Due to this EMS was called. When patient arrived in the ED he was also noticed to have a large episode of bright red blood emesis. The ED physician did speak to the GI doctor on call who is planning to take the patient to the endoscopy right away. Patient currently is on a nonrebreather and is receiving 2 units of emergent blood. He is started on octreotide drip and Protonix drip. He denies any abdominal pain but does complain of chest pain. Denies any shortness of breath fevers or chills PAST MEDICAL HISTORY:   Past Medical History  Diagnosis Date  . CAD (coronary artery disease)   . MI (myocardial infarction)   . PVD (peripheral vascular disease)   . DM (diabetes mellitus)   . CVA (cerebral infarction)   . HTN (hypertension)   . TIA (transient ischemic attack)     PAST SURGICAL HISTORY:  Past Surgical History  Procedure Laterality Date  . Carotid endarterectomy    . Coronary artery bypass graft    . Leg stent      SOCIAL HISTORY:  Social History  Substance Use Topics  . Smoking status: Never Smoker   . Smokeless tobacco: Never Used  . Alcohol Use: No    FAMILY HISTORY:  Family History   Problem Relation Age of Onset  . CAD      DRUG ALLERGIES:  Allergies  Allergen Reactions  . No Known Allergies     REVIEW OF SYSTEMS:   CONSTITUTIONAL: No fever, positive fatigue and weakness.  EYES: No blurred or double vision.  EARS, NOSE, AND THROAT: No tinnitus or ear pain.  RESPIRATORY: No cough, shortness of breath, wheezing or hemoptysis.  CARDIOVASCULAR: No chest pain, orthopnea, edema.  GASTROINTESTINAL: No nausea, vomiting, diarrhea or abdominal pain. Bright red blood emesis GENITOURINARY: No dysuria, hematuria.  ENDOCRINE: No polyuria, nocturia,  HEMATOLOGY: No anemia, easy bruising or bleeding SKIN: No rash or lesion. MUSCULOSKELETAL: No joint pain or arthritis.   NEUROLOGIC: No tingling, numbness, weakness.  PSYCHIATRY: No anxiety or depression.   MEDICATIONS AT HOME:  Prior to Admission medications   Medication Sig Start Date End Date Taking? Authorizing Provider  aspirin EC 81 MG tablet Take 81 mg by mouth. 05/12/10  Yes Historical Provider, MD  atorvastatin (LIPITOR) 40 MG tablet TAKE 1 TABLET BY MOUTH EVERY DAY 09/16/14  Yes Juline Patch, MD  benazepril (LOTENSIN) 40 MG tablet Take 40 mg by mouth daily.  06/19/12  Yes Historical Provider, MD  clopidogrel (PLAVIX) 75 MG tablet Take 75 mg by mouth daily.   Yes Historical Provider, MD  losartan (COZAAR) 50 MG tablet TAKE 1 TABLET BY MOUTH ONCE DAILY 08/13/14  Yes Juline Patch, MD  meclizine (ANTIVERT) 25 MG tablet Take 1 tablet (25 mg total) by mouth 3 (three) times daily as needed. Patient taking differently: Take 25 mg by mouth 3 (three) times daily.  09/12/14  Yes Juline Patch, MD  naproxen (NAPROSYN) 375 MG tablet Take 375 mg by mouth 2 (two) times daily with a meal.   Yes Historical Provider, MD  PARoxetine (PAXIL) 10 MG tablet Take 1 tablet (10 mg total) by mouth daily. 09/10/14  Yes Juline Patch, MD  ranitidine (ZANTAC) 300 MG tablet TAKE ONE TABLET BY MOUTH DAILY 10/31/14  Yes Juline Patch, MD   spironolactone (ALDACTONE) 25 MG tablet Take 12.5 mg by mouth daily.   Yes Historical Provider, MD      PHYSICAL EXAMINATION:   VITAL SIGNS: Blood pressure 89/64, pulse 86, temperature 97.3 F (36.3 C), temperature source Axillary, resp. rate 15, height 5\' 7"  (1.702 m), weight 63.504 kg (140 lb), SpO2 100 %.  GENERAL:  74 y.o.-year-old patient lying in the bed critically ill-appearing male , cachectic EYES: Pupils equal, round, reactive to light and accommodation. No scleral icterus. Extraocular muscles intact.  HEENT: Head atraumatic, normocephalic. Oropharynx and nasopharynx clear.  NECK:  Supple, no jugular venous distention. No thyroid enlargement, no tenderness.  LUNGS: Normal breath sounds bilaterally, no wheezing, rales,rhonchi or crepitation. No use of accessory muscles of respiration.  CARDIOVASCULAR: S1, S2 normal. No murmurs, rubs, or gallops.  ABDOMEN: Soft, nontender, nondistended. Bowel sounds present. No organomegaly or mass.  EXTREMITIES: No pedal edema, cyanosis, or clubbing.  NEUROLOGIC: Cranial nerves II through XII are intact. Muscle strength 5/5 in all extremities. Sensation intact. Gait not checked.  PSYCHIATRIC: The patient is alert and oriented x 3.  SKIN: No obvious rash, lesion, or ulcer.   LABORATORY PANEL:   CBC  Recent Labs Lab 11/08/14 1629  WBC 10.2  HGB 9.8*  HCT 28.7*  PLT 204  MCV 94.6  MCH 32.2  MCHC 34.0  RDW 13.4  LYMPHSABS 1.1  MONOABS 0.7  EOSABS 0.1  BASOSABS 0.0   ------------------------------------------------------------------------------------------------------------------  Chemistries  No results for input(s): NA, K, CL, CO2, GLUCOSE, BUN, CREATININE, CALCIUM, MG, AST, ALT, ALKPHOS, BILITOT in the last 168 hours.  Invalid input(s): GFRCGP ------------------------------------------------------------------------------------------------------------------ CrCl cannot be calculated (Patient has no serum creatinine result on  file.). ------------------------------------------------------------------------------------------------------------------ No results for input(s): TSH, T4TOTAL, T3FREE, THYROIDAB in the last 72 hours.  Invalid input(s): FREET3   Coagulation profile  Recent Labs Lab 11/08/14 1629  INR 1.20   ------------------------------------------------------------------------------------------------------------------- No results for input(s): DDIMER in the last 72 hours. -------------------------------------------------------------------------------------------------------------------  Cardiac Enzymes No results for input(s): CKMB, TROPONINI, MYOGLOBIN in the last 168 hours.  Invalid input(s): CK ------------------------------------------------------------------------------------------------------------------ Invalid input(s): POCBNP  ---------------------------------------------------------------------------------------------------------------  Urinalysis    Component Value Date/Time   COLORURINE YELLOW* 09/05/2014 1331   APPEARANCEUR CLEAR* 09/05/2014 1331   LABSPEC 1.016 09/05/2014 1331   PHURINE 5.0 09/05/2014 1331   GLUCOSEU 50* 09/05/2014 1331   HGBUR 1+* 09/05/2014 1331   BILIRUBINUR NEGATIVE 09/05/2014 1331   KETONESUR NEGATIVE 09/05/2014 1331   PROTEINUR NEGATIVE 09/05/2014 1331   NITRITE NEGATIVE 09/05/2014 1331   LEUKOCYTESUR NEGATIVE 09/05/2014 1331     RADIOLOGY: Dg Chest Portable 1 View  11/08/2014   CLINICAL DATA:  GI bleeding, smoker  EXAM: PORTABLE CHEST 1 VIEW  COMPARISON:  Portable chest x-ray of January 31, 2012  FINDINGS: The lungs  are adequately inflated. There is no focal infiltrate. There is a small left pleural effusion. The heart is normal in size. The pulmonary vascularity is not engorged. There are post CABG changes.  IMPRESSION: There is no evidence of pneumonia. A small left pleural effusion is suspected. When the patient can tolerate the procedure, a  PA and lateral chest x-ray would be of value.   Electronically Signed   By: David  Martinique M.D.   On: 11/08/2014 17:11    EKG: Orders placed or performed during the hospital encounter of 11/08/14  . EKG 12-Lead  . EKG 12-Lead  . EKG 12-Lead  . EKG 12-Lead    IMPRESSION AND PLAN: Patient is a 74 year old white male with coronary artery disease, CVA, diabetes type 2, hypertension presents with upper GI bleed  1. Upper GI bleed; suspect due to NSAIDs, patient will have emergent EGD, he is currently receiving transfusion emergently, we'll continue Protonix drip and octreotide for now until EGD information is provided. Nothing by mouth for now  2. Coronary artery disease with chest pain check cardiac enzymes he is at high risk of having ischemia  3. History of diabetes not on any medication currently due to weight loss: Sliding scale for now  4. Hypertension: Currently hypotensive hold blood pressure medications  5. Hyperlipidemia in light of nothing by mouth state will hold oral meds for now  6. Miscellaneous no anticoagulation for DVT prophylaxis due to patient's active GI bleed      All the records are reviewed and case discussed with ED provider. Management plans discussed with the patient, family and they are in agreement.  CODE STATUS: Full    Code Status Orders        Start     Ordered   11/08/14 1712  Full code   Continuous     11/08/14 1712       TOTAL TIME TAKING CARE OF THIS PATIENT: 55 critical care minutes.    Dustin Flock M.D on 11/08/2014 at 5:16 PM  Between 7am to 6pm - Pager - [phone]  After 6pm go to [email or web address] - password [email or web address] [facility]  [facility]  Office  [phone]  CC: Primary care physician; Otilio Miu, MD

## 2014-11-08 NOTE — Anesthesia Preprocedure Evaluation (Signed)
Anesthesia Evaluation  Patient identified by MRN, date of birth, ID band Patient awake    Reviewed: Allergy & Precautions, H&P , NPO status , Patient's Chart, lab work & pertinent test results, reviewed documented beta blocker date and time   Airway Mallampati: II  TM Distance: >3 FB Neck ROM: full    Dental no notable dental hx. (+) Teeth Intact   Pulmonary neg pulmonary ROS, Current Smoker,    Pulmonary exam normal breath sounds clear to auscultation       Cardiovascular Exercise Tolerance: Good + CAD  negative cardio ROS Normal cardiovascular exam Rhythm:regular Rate:Normal     Neuro/Psych negative neurological ROS  negative psych ROS   GI/Hepatic negative GI ROS, Neg liver ROS,   Endo/Other  negative endocrine ROSdiabetes  Renal/GU negative Renal ROS  negative genitourinary   Musculoskeletal   Abdominal   Peds  Hematology negative hematology ROS (+)   Anesthesia Other Findings   Reproductive/Obstetrics negative OB ROS                             Anesthesia Physical Anesthesia Plan  ASA: II and emergent  Anesthesia Plan: General   Post-op Pain Management:    Induction:   Airway Management Planned:   Additional Equipment:   Intra-op Plan:   Post-operative Plan:   Informed Consent: I have reviewed the patients History and Physical, chart, labs and discussed the procedure including the risks, benefits and alternatives for the proposed anesthesia with the patient or authorized representative who has indicated his/her understanding and acceptance.   Dental Advisory Given  Plan Discussed with: CRNA  Anesthesia Plan Comments:         Anesthesia Quick Evaluation

## 2014-11-08 NOTE — ED Provider Notes (Signed)
[facility] Emergency Department Provider Note  ____________________________________________  Time seen: Approximately 4:22 PM  I have reviewed the triage vital signs and the nursing notes.   HISTORY  Chief Complaint GI Bleeding    HPI Ricky Rangel is a 74 y.o. male with history of coronary artery disease status post stents, diabetes, hypertension, GERD presents for evaluation of gross hematemesis, large volume which began suddenly just prior to arrival and is ongoing. He denies any abdominal pain. He denies any chest pain, no recent illness including no cough, sneezing, runny nose, congestion, vomiting or diarrhea. Per EMS he had vomited up several bags of dark blood with clots. No history of similar events. Per EMS, severely hypotensive with initial blood pressure in the 70s which improved with fluids. No modifying factors.   Past Medical History  Diagnosis Date  . CAD (coronary artery disease)   . MI (myocardial infarction)   . PVD (peripheral vascular disease)   . DM (diabetes mellitus)   . CVA (cerebral infarction)   . HTN (hypertension)   . TIA (transient ischemic attack)     Patient Active Problem List   Diagnosis Date Noted  . Upper GI bleed 11/08/2014  . Acute upper GI bleed   . Acute esophagitis   . Gastric peptic ulcer   . 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 CAD (coronary artery disease) of artery bypass graft 05/14/2014  . CVA (cerebral infarction) 05/14/2014  . DM (diabetes mellitus) type 2, uncontrolled, with ketoacidosis 05/14/2014  . H/O carotid endarterectomy 05/14/2014    Past Surgical History  Procedure Laterality Date  . Carotid endarterectomy    . Coronary artery bypass graft    . Leg stent      No current outpatient prescriptions on file.  Allergies No known allergies  Family History  Problem Relation Age of Onset  . CAD      Social History Social History  Substance Use Topics  . Smoking status: Never  Smoker   . Smokeless tobacco: Never Used  . Alcohol Use: No    Review of Systems Constitutional: No fever/chills Eyes: No visual changes. ENT: No sore throat. Cardiovascular: Denies chest pain. Respiratory: Denies shortness of breath. Gastrointestinal: No abdominal pain.  + nausea, + vomiting.  No diarrhea.  No constipation. Genitourinary: Negative for dysuria. Musculoskeletal: Negative for back pain. Skin: Negative for rash. Neurological: Negative for headaches, focal weakness or numbness.  10-point ROS otherwise negative.  ____________________________________________   PHYSICAL EXAM: Filed Vitals:   11/08/14 1900 11/08/14 1906 11/08/14 2000 11/08/14 2315  BP: 104/57  113/68 122/75  Pulse:   91   Temp:  98.4 F (36.9 C) 97.7 F (36.5 C) 97.9 F (36.6 C)  TempSrc:    Oral  Resp:   21   Height:      Weight:      SpO2:   100%       Constitutional: Alert and oriented. Pale and ill-appearing.Several bags of dark bloody emesis with clots on his bed. Mentating appropriately. Eyes: Conjunctivae are normal. PERRL. EOMI. Head: Atraumatic. Nose: No congestion/rhinnorhea. Mouth/Throat: Mucous membranes are moist. Residual blood in the mouth and oropharynx. Neck: No stridor.  Cardiovascular: Normal rate, regular rhythm. Grossly normal heart sounds.  Good peripheral circulation. Respiratory: Normal respiratory effort.  No retractions. Lungs CTAB. Gastrointestinal: Soft and nontender. No distention. No abdominal bruits. No CVA tenderness. Genitourinary: deferred Musculoskeletal: No lower extremity tenderness nor edema.  No joint effusions. Neurologic:  Normal speech and language. No  gross focal neurologic deficits are appreciated.  Skin:  Skin is cool, dry and intact. No rash noted. Skin pallor. Psychiatric: Mood and affect are normal. Speech and behavior are normal.  ____________________________________________   LABS (all labs ordered are listed, but only abnormal  results are displayed)  Labs Reviewed  COMPREHENSIVE METABOLIC PANEL - Abnormal; Notable for the following:    Sodium 132 (*)    Potassium 6.0 (*)    CO2 18 (*)    Glucose, Bld 177 (*)    BUN 143 (*)    Creatinine, Ser <0.30 (*)    Calcium 8.6 (*)    Total Protein 5.8 (*)    Albumin 3.2 (*)    ALT 15 (*)    All other components within normal limits  CBC WITH DIFFERENTIAL/PLATELET - Abnormal; Notable for the following:    RBC 3.04 (*)    Hemoglobin 9.8 (*)    HCT 28.7 (*)    Neutro Abs 8.4 (*)    All other components within normal limits  LIPASE, BLOOD - Abnormal; Notable for the following:    Lipase 59 (*)    All other components within normal limits  PROTIME-INR - Abnormal; Notable for the following:    Prothrombin Time 15.4 (*)    All other components within normal limits  HEMOGLOBIN - Abnormal; Notable for the following:    Hemoglobin 10.2 (*)    All other components within normal limits  HEMOGLOBIN - Abnormal; Notable for the following:    Hemoglobin 10.0 (*)    All other components within normal limits  TROPONIN I - Abnormal; Notable for the following:    Troponin I 0.04 (*)    All other components within normal limits  MRSA PCR SCREENING  APTT  CBC  BASIC METABOLIC PANEL  HEMOGLOBIN  TROPONIN I  TROPONIN I  HEMOGLOBIN  PREPARE RBC (CROSSMATCH)  TYPE AND SCREEN  ABO/RH  TYPE AND SCREEN   ____________________________________________  EKG  ED ECG REPORT I, Joanne Gavel, the attending physician, personally viewed and interpreted this ECG.   Date: 11/08/2014  EKG Time: 16:10  Rate: 102  Rhythm: sinus tachycardia  Axis: normal  Intervals:left bundle branch block, incomplete  ST&T Change: No acute ST elevation. EKG generally unchanged from prior. ____________________________________________  RADIOLOGY  CXR ____________________________________________   PROCEDURES  Procedure(s) performed: None  Critical Care performed: Yes, see critical care  note(s). Total critical care time spent 50 minutes.   ____________________________________________   INITIAL IMPRESSION / ASSESSMENT AND PLAN / ED COURSE  Pertinent labs & imaging results that were available during my care of the patient were reviewed by me and considered in my medical decision making (see chart for details).  Juanangel Karl Erway is a 74 y.o. male with history of coronary artery disease status post stents, diabetes, hypertension, GERD presents for evaluation of gross hematemesis consistent with active/brisk upper GI bleed. He has vomited approximately 700-800 cc of dark blood with clots which are in bags at his bedside. He is hemodynamically unstable with systolic blood pressure in the 70s on arrival which has improved to systolic of 254 with IV fluids. He has been consented for emergency release blood. We'll transfuse 2 units. IV Protonix and octreotide bolus and drips have been ordered. Case was discussed with Dr. Allen Norris of GI who is arranging emergent endoscopy. Case discussed with hospitalist, Dr. Posey Pronto for admission at 5 PM. ____________________________________________   FINAL CLINICAL IMPRESSION(S) / ED DIAGNOSES  Final diagnoses:  Acute upper GI  bleed  Hypotension, unspecified hypotension type      Joanne Gavel, MD 11/09/14 [phone]

## 2014-11-08 NOTE — ED Notes (Signed)
Pt via ems from home with GI bleed. Pt came with emesis bags of bloody vomit and covered in feces (some very dry). Pt alert & oriented.

## 2014-11-08 NOTE — ED Notes (Signed)
Pt transported to endo. 1st unit finished and second unit sent in cooler with pt

## 2014-11-09 ENCOUNTER — Inpatient Hospital Stay: Payer: Medicare Other

## 2014-11-09 LAB — POTASSIUM
POTASSIUM: 5.9 mmol/L — AB (ref 3.5–5.1)
Potassium: 5.6 mmol/L — ABNORMAL HIGH (ref 3.5–5.1)

## 2014-11-09 LAB — TROPONIN I
TROPONIN I: 3.12 ng/mL — AB (ref <0.031)
TROPONIN I: 4.58 ng/mL — AB (ref <0.031)
Troponin I: 4.19 ng/mL — ABNORMAL HIGH (ref <0.031)
Troponin I: 3.44 ng/mL — ABNORMAL HIGH (ref <0.031)

## 2014-11-09 LAB — BASIC METABOLIC PANEL
Anion gap: 4 — ABNORMAL LOW (ref 5–15)
BUN: 120 mg/dL — AB (ref 6–20)
CHLORIDE: 113 mmol/L — AB (ref 101–111)
CO2: 19 mmol/L — ABNORMAL LOW (ref 22–32)
Calcium: 8.6 mg/dL — ABNORMAL LOW (ref 8.9–10.3)
Creatinine, Ser: 2.1 mg/dL — ABNORMAL HIGH (ref 0.61–1.24)
GFR calc Af Amer: 34 mL/min — ABNORMAL LOW (ref >60)
GFR calc non Af Amer: 29 mL/min — ABNORMAL LOW (ref >60)
GLUCOSE: 124 mg/dL — AB (ref 65–99)
POTASSIUM: 6.4 mmol/L — AB (ref 3.5–5.1)
Sodium: 136 mmol/L (ref 135–145)

## 2014-11-09 LAB — TYPE AND SCREEN
ABO/RH(D): A POS
Antibody Screen: NEGATIVE
UNIT DIVISION: 0
Unit division: 0
Unit division: 0

## 2014-11-09 LAB — CBC
HEMATOCRIT: 33.3 % — AB (ref 40.0–52.0)
HEMOGLOBIN: 11.5 g/dL — AB (ref 13.0–18.0)
MCH: 31.7 pg (ref 26.0–34.0)
MCHC: 34.5 g/dL (ref 32.0–36.0)
MCV: 92 fL (ref 80.0–100.0)
Platelets: 164 10*3/uL (ref 150–440)
RBC: 3.62 MIL/uL — AB (ref 4.40–5.90)
RDW: 13.9 % (ref 11.5–14.5)
WBC: 8.6 10*3/uL (ref 3.8–10.6)

## 2014-11-09 LAB — GLUCOSE, CAPILLARY
GLUCOSE-CAPILLARY: 111 mg/dL — AB (ref 65–99)
GLUCOSE-CAPILLARY: 121 mg/dL — AB (ref 65–99)
GLUCOSE-CAPILLARY: 89 mg/dL (ref 65–99)
Glucose-Capillary: 175 mg/dL — ABNORMAL HIGH (ref 65–99)
Glucose-Capillary: 92 mg/dL (ref 65–99)
Glucose-Capillary: 75 mg/dL (ref 65–99)

## 2014-11-09 LAB — HEMOGLOBIN: Hemoglobin: 11 g/dL — ABNORMAL LOW (ref 13.0–18.0)

## 2014-11-09 MED ORDER — INSULIN REGULAR HUMAN 100 UNIT/ML IJ SOLN
10.0000 [IU] | Freq: Once | INTRAMUSCULAR | Status: AC
  Administered 2014-11-09: 10 [IU] via INTRAVENOUS
  Filled 2014-11-09: qty 0.1

## 2014-11-09 MED ORDER — CALCIUM GLUCONATE 10 % IV SOLN
1.0000 g | Freq: Once | INTRAVENOUS | Status: AC
  Administered 2014-11-09: 1 g via INTRAVENOUS
  Filled 2014-11-09: qty 10

## 2014-11-09 MED ORDER — DEXTROSE 50 % IV SOLN
25.0000 mL | Freq: Once | INTRAVENOUS | Status: AC
  Administered 2014-11-09: 25 mL via INTRAVENOUS
  Filled 2014-11-09: qty 50

## 2014-11-09 MED ORDER — ALBUTEROL SULFATE (2.5 MG/3ML) 0.083% IN NEBU
2.5000 mg | INHALATION_SOLUTION | RESPIRATORY_TRACT | Status: AC
Start: 2014-11-09 — End: 2014-11-09
  Administered 2014-11-09 (×3): 2.5 mg via RESPIRATORY_TRACT
  Filled 2014-11-09: qty 6
  Filled 2014-11-09: qty 3

## 2014-11-09 NOTE — Progress Notes (Signed)
Skin checked by Crystal M RN 

## 2014-11-09 NOTE — Progress Notes (Signed)
A & O. 2 L of oxygen. NSR. NPO. No bloody stools. No pain. Pt has no further concerns at this time.

## 2014-11-09 NOTE — Progress Notes (Signed)
Updated son Leroy Sea that patient was moving to room 241.

## 2014-11-09 NOTE — Progress Notes (Signed)
Dr Anselm Jungling and Dr. Clayborn Bigness notified of troponin value and patient complaint of heartburn, ECG and additional troponin orders entered.

## 2014-11-09 NOTE — Consult Note (Signed)
Date: 11/09/2014                  Patient Name:  Ricky Rangel  MRN: [id number]  DOB: 1940/06/20  Age / Sex: 74 y.o., male         PCP: Otilio Miu, MD                 Service Requesting Consult: Internal Medicine                 Reason for Consult: ARF, Hyperkalemia            History of Present Illness: Patient is a 74 y.o. male with medical problems of CAD, CABG at [place] 10 yrs ago, angioplasty and stent, PVD, DM, CVA, HTN who was admitted to [facility] on 11/08/2014 for evaluation of bloody emesis.  He underwent EGD yesterday. He was transfused and bleeding was stopped with cautery. Creatinine at the time of admission was less than 0.3, BUN was 143 Today's creatinine has increased to 2.10, BUN of 120 Patient states that his mouth feels dry. No shortness of breath No pain  Medications: Outpatient medications: Prescriptions prior to admission  Medication Sig Dispense Refill Last Dose  . aspirin EC 81 MG tablet Take 81 mg by mouth.   unknown at unknown  . atorvastatin (LIPITOR) 40 MG tablet TAKE 1 TABLET BY MOUTH EVERY DAY 30 tablet 3 unknown at unknown  . benazepril (LOTENSIN) 40 MG tablet Take 40 mg by mouth daily.    unknown at unknown  . clopidogrel (PLAVIX) 75 MG tablet Take 75 mg by mouth daily.   unknown at unknown  . losartan (COZAAR) 50 MG tablet TAKE 1 TABLET BY MOUTH ONCE DAILY 30 tablet 1 unknown at unknown  . meclizine (ANTIVERT) 25 MG tablet Take 1 tablet (25 mg total) by mouth 3 (three) times daily as needed. (Patient taking differently: Take 25 mg by mouth 3 (three) times daily. ) 30 tablet 0 unknown at unknown  . naproxen (NAPROSYN) 375 MG tablet Take 375 mg by mouth 2 (two) times daily with a meal.   unknown at unknown  . PARoxetine (PAXIL) 10 MG tablet Take 1 tablet (10 mg total) by mouth daily. 30 tablet 0 unknown at unknown  . ranitidine (ZANTAC) 300 MG tablet TAKE ONE TABLET BY MOUTH DAILY 30 tablet 1 unknown at unknown  . spironolactone (ALDACTONE) 25 MG  tablet Take 12.5 mg by mouth daily.   unknown at unknown    Current medications: Current Facility-Administered Medications  Medication Dose Route Frequency Provider Last Rate Last Dose  . 0.9 %  sodium chloride infusion  10 mL/hr Intravenous Once Joanne Gavel, MD      . 0.9 %  sodium chloride infusion   Intravenous Continuous Dustin Flock, MD 125 mL/hr at 11/09/14 1000    . acetaminophen (TYLENOL) tablet 650 mg  650 mg Oral Q6H PRN Dustin Flock, MD       Or  . acetaminophen (TYLENOL) suppository 650 mg  650 mg Rectal Q6H PRN Dustin Flock, MD      . octreotide (SANDOSTATIN) 500 mcg in sodium chloride 0.9 % 250 mL (2 mcg/mL) infusion  50 mcg/hr Intravenous Continuous Joanne Gavel, MD 25 mL/hr at 11/09/14 0244 50 mcg/hr at 11/09/14 0244  . octreotide (SANDOSTATIN) 500 mcg in sodium chloride 0.9 % 250 mL (2 mcg/mL) infusion  25-50 mcg/hr Intravenous Continuous Dustin Flock, MD 25 mL/hr at 11/09/14 1000 50 mcg/hr at 11/09/14 1000  .  ondansetron (ZOFRAN) tablet 4 mg  4 mg Oral Q6H PRN Dustin Flock, MD       Or  . ondansetron (ZOFRAN) injection 4 mg  4 mg Intravenous Q6H PRN Dustin Flock, MD   4 mg at 11/08/14 1742  . pantoprazole (PROTONIX) 80 mg in sodium chloride 0.9 % 100 mL IVPB  80 mg Intravenous Once Joanne Gavel, MD      . pantoprazole (PROTONIX) 80 mg in sodium chloride 0.9 % 250 mL (0.32 mg/mL) infusion  8 mg/hr Intravenous Continuous Dustin Flock, MD 25 mL/hr at 11/09/14 1000 8 mg/hr at 11/09/14 1000      Allergies: Allergies  Allergen Reactions  . No Known Allergies       Past Medical History: Past Medical History  Diagnosis Date  . CAD (coronary artery disease)   . MI (myocardial infarction)   . PVD (peripheral vascular disease)   . DM (diabetes mellitus)   . CVA (cerebral infarction)   . HTN (hypertension)   . TIA (transient ischemic attack)      Past Surgical History: Past Surgical History  Procedure Laterality Date  . Carotid endarterectomy    .  Coronary artery bypass graft    . Leg stent       Family History: Family History  Problem Relation Age of Onset  . CAD       Social History: Social History   Social History  . Marital Status: Unknown    Spouse Name: N/A  . Number of Children: N/A  . Years of Education: N/A   Occupational History  . Not on file.   Social History Main Topics  . Smoking status: Never Smoker   . Smokeless tobacco: Never Used  . Alcohol Use: No  . Drug Use: No  . Sexual Activity: Not on file   Other Topics Concern  . Not on file   Social History Narrative     Review of Systems: Gen: No weight changes recently, no fevers or chills HEENT: Dry mouth CV: No chest pain, history of CABG Resp: No breathing troubles at present GI: Upper GI bleed, underwent EGD yesterday GU : No problems with urination MS: No pain Derm:  No complaints Psych: No complaints Heme: No complaints Neuro: No complaints Endocrine no complaints  Vital Signs: Blood pressure 98/61, pulse 94, temperature 98.2 F (36.8 C), temperature source Oral, resp. rate 12, height 5\' 7"  (1.702 m), weight 63.504 kg (140 lb), SpO2 100 %.   Intake/Output Summary (Last 24 hours) at 11/09/14 1040 Last data filed at 11/09/14 0849  Gross per 24 hour  Intake   1542 ml  Output    950 ml  Net    592 ml    Weight trends: [employer]   11/08/14 1627  Weight: 63.504 kg (140 lb)    Physical Exam: General:  thin, no acute distress, laying in the bed   HEENT  anicteric, somewhat dry oral mucous membranes   Neck:  supple   Lungs:  normal effort, clear to auscultation   Heart::  regular, no rub or gallop   Abdomen:  soft, nontender   Extremities:  no peripheral edema   Neurologic:  alert, oriented   Skin:  no acute rashes   Access:   Foley:        Lab results: Basic Metabolic Panel:  Recent Labs Lab 11/08/14 1629 11/09/14 0513  NA 132* 136  K 6.0* 6.4*  CL 105 113*  CO2 18* 19*  GLUCOSE  177* 124*  BUN 143*  120*  CREATININE <0.30* 2.10*  CALCIUM 8.6* 8.6*    Liver Function Tests:  Recent Labs Lab 11/08/14 1629  AST 19  ALT 15*  ALKPHOS 47  BILITOT 0.6  PROT 5.8*  ALBUMIN 3.2*    Recent Labs Lab 11/08/14 1629  LIPASE 59*   No results for input(s): AMMONIA in the last 168 hours.  CBC:  Recent Labs Lab 11/08/14 1629  11/08/14 2315 11/09/14 0513  WBC 10.2  --   --  8.6  NEUTROABS 8.4*  --   --   --   HGB 9.8*  < > 10.0* 11.5*  HCT 28.7*  --   --  33.3*  MCV 94.6  --   --  92.0  PLT 204  --   --  164  < > = values in this interval not displayed.  Cardiac Enzymes:  Recent Labs Lab 11/09/14 0921  TROPONINI 4.58*    BNP: Invalid input(s): POCBNP  CBG:  Recent Labs Lab 11/09/14 0352 11/09/14 0746 11/09/14 0943  GLUCAP 111* 121* 175*    Microbiology: Recent Results (from the past 720 hour(s))  MRSA PCR Screening     Status: None   Collection Time: 11/08/14  7:15 PM  Result Value Ref Range Status   MRSA by PCR NEGATIVE NEGATIVE Final    Comment:        The GeneXpert MRSA Assay (FDA approved for NASAL specimens only), is one component of a comprehensive MRSA colonization surveillance program. It is not intended to diagnose MRSA infection nor to guide or monitor treatment for MRSA infections.      Coagulation Studies:  Recent Labs  11/08/14 1629  LABPROT 15.4*  INR 1.20    Urinalysis: No results for input(s): COLORURINE, LABSPEC, PHURINE, GLUCOSEU, HGBUR, BILIRUBINUR, KETONESUR, PROTEINUR, UROBILINOGEN, NITRITE, LEUKOCYTESUR in the last 72 hours.  Invalid input(s): APPERANCEUR    Imaging: Dg Chest Portable 1 View  11/08/2014   CLINICAL DATA:  GI bleeding, smoker  EXAM: PORTABLE CHEST 1 VIEW  COMPARISON:  Portable chest x-ray of January 31, 2012  FINDINGS: The lungs are adequately inflated. There is no focal infiltrate. There is a small left pleural effusion. The heart is normal in size. The pulmonary vascularity is not engorged. There  are post CABG changes.  IMPRESSION: There is no evidence of pneumonia. A small left pleural effusion is suspected. When the patient can tolerate the procedure, a PA and lateral chest x-ray would be of value.   Electronically Signed   By: David  Martinique M.D.   On: 11/08/2014 17:11      Assessment & Plan: Pt is a 74 y.o. yo male with medical problems of CAD, CABG at [place] 10 yrs ago, angioplasty and stent 2 yrs ago, PVD, DM, CVA, HTN who was admitted to [facility] on 11/08/2014 for evaluation of bloody emesis.  1. Acute renal failure. Likely secondary to ATN from hemodynamic compromise due to GI bleed - Agree with volume resuscitation Currently saline at 125 cc per hour 2. Hyperkalemia Likely secondary to GI bleed and blood transfusions Agree with shifting measures Hopefully with increased urine output, potassium is improved Did discuss with patient possibility of needing dialysis if hyperkalemia is refractory 3. GI bleed secondary to gastric ulcer Underwent EGD and cauterization 9/30 Blood transfusion

## 2014-11-09 NOTE — Progress Notes (Signed)
   11/09/14 1215  Clinical Encounter Type  Visited With Patient  Visit Type Follow-up  Referral From Nurse  Consult/Referral To Chaplain  Completed and filed new AD.

## 2014-11-09 NOTE — Progress Notes (Signed)
   11/09/14 1115  Clinical Encounter Type  Visited With Patient  Visit Type Spiritual support  Referral From Nurse  Consult/Referral To Chaplain  Spiritual Encounters  Spiritual Needs Literature  Request for changes to AD information.

## 2014-11-09 NOTE — Progress Notes (Signed)
Subjective: This patient was admitted yesterday with a brisk upper GI bleed and had an emergency EGD by me. The patient had multiple small lesions bleeding in his stomach with a copious amount of clots and blood in the stomach. The patient was transfused and bleeding was stopped with cautery. The patient was noted to have an increased troponin this morning. His hemoglobin has come up to 11 and he is not having any signs of further bleeding.   Objective: Vital signs in last 24 hours: Filed Vitals:   11/09/14 0030 11/09/14 0045 11/09/14 0100 11/09/14 0200  BP: 111/71  137/75   Pulse: 90 85 92   Temp:    98 F (36.7 C)  TempSrc:    Oral  Resp: 14 16 18 18   Height:      Weight:      SpO2: 100% 100% 100%    Weight change:   Intake/Output Summary (Last 24 hours) at 11/09/14 0747 Last data filed at 11/09/14 0300  Gross per 24 hour  Intake   1542 ml  Output    450 ml  Net   1092 ml     Exam: S1S2 present or without murmur or extra heart sounds clear to auscultation soft, nontender, normal bowel sounds   Lab Results: @LABTEST2 @ Micro Results: Recent Results (from the past 240 hour(s))  MRSA PCR Screening     Status: None   Collection Time: 11/08/14  7:15 PM  Result Value Ref Range Status   MRSA by PCR NEGATIVE NEGATIVE Final    Comment:        The GeneXpert MRSA Assay (FDA approved for NASAL specimens only), is one component of a comprehensive MRSA colonization surveillance program. It is not intended to diagnose MRSA infection nor to guide or monitor treatment for MRSA infections.    Studies/Results: Dg Chest Portable 1 View  11/08/2014   CLINICAL DATA:  GI bleeding, smoker  EXAM: PORTABLE CHEST 1 VIEW  COMPARISON:  Portable chest x-ray of January 31, 2012  FINDINGS: The lungs are adequately inflated. There is no focal infiltrate. There is a small left pleural effusion. The heart is normal in size. The pulmonary vascularity is not engorged. There are post CABG  changes.  IMPRESSION: There is no evidence of pneumonia. A small left pleural effusion is suspected. When the patient can tolerate the procedure, a PA and lateral chest x-ray would be of value.   Electronically Signed   By: David  Martinique M.D.   On: 11/08/2014 17:11   Medications: I have reviewed the patient's current medications. Scheduled Meds: . sodium chloride  10 mL/hr Intravenous Once  . pantoprazole (PROTONIX) IV  80 mg Intravenous Once   Continuous Infusions: . sodium chloride    . octreotide  (SANDOSTATIN)    IV infusion 50 mcg/hr (11/09/14 0244)  . octreotide  (SANDOSTATIN)    IV infusion    . pantoprozole (PROTONIX) infusion 8 mg/hr (11/09/14 0244)   PRN Meds:.acetaminophen **OR** acetaminophen, ondansetron **OR** ondansetron (ZOFRAN) IV Assessment: Active Problems:   Upper GI bleed   Acute upper GI bleed   Acute esophagitis   Gastric peptic ulcer    Plan:  This patient is status post EGD with a brisk upper GI bleed with a upper endoscopy done yesterday with cautery of the lesions. The patient has no further sign of bleeding and his hemoglobin has been stable. The patient's troponin is elevated this morning. Recommend continue following hemoglobin and hematocrit. We'll leave NG tube out for now.  LOS: 1 day   Ricky Rangel 11/09/2014, 7:47 AM

## 2014-11-09 NOTE — Progress Notes (Signed)
Patient is arousable to voice, flat affect. Napping between care. Reporting heartburn this morning, no pain, complaint forwarded to MD with no new orders. No emesis today. Brown BM x 1. Voiding in urinal. MD notified of abnormal labs, orders in computer. X-wife at bedside, expressed concerns for patient returning home alone, social work consult ordered per family request and concerns. Resting quietly between care. Report called to [facility]. Will transfer shortly.

## 2014-11-09 NOTE — Progress Notes (Addendum)
[place] [facility] [place] NAME: Ricky Rangel    MR#:  [id number]  DATE OF BIRTH:  1941-02-06  SUBJECTIVE:  CHIEF COMPLAINT:   Chief Complaint  Patient presents with  . GI Bleeding     Had endoscopy done- showed gastric ulcer- cauterization done.   C/o chest Vs epigastric pain today- Troponin elevated. He said pain is more like " Acidity"  REVIEW OF SYSTEMS:  CONSTITUTIONAL: No fever, fatigue or weakness.  EYES: No blurred or double vision.  EARS, NOSE, AND THROAT: No tinnitus or ear pain.  RESPIRATORY: No cough, shortness of breath, wheezing or hemoptysis.  CARDIOVASCULAR: positive for chest pain, no orthopnea, edema.  GASTROINTESTINAL: No nausea, vomiting, diarrhea or abdominal pain.  GENITOURINARY: No dysuria, hematuria.  ENDOCRINE: No polyuria, nocturia,  HEMATOLOGY: No anemia, easy bruising or bleeding SKIN: No rash or lesion. MUSCULOSKELETAL: No joint pain or arthritis.   NEUROLOGIC: No tingling, numbness, weakness.  PSYCHIATRY: No anxiety or depression.   ROS  DRUG ALLERGIES:   Allergies  Allergen Reactions  . No Known Allergies     VITALS:  Blood pressure 125/84, pulse 95, temperature 98 F (36.7 C), temperature source Oral, resp. rate 12, height 5\' 7"  (1.702 m), weight 63.504 kg (140 lb), SpO2 100 %.  PHYSICAL EXAMINATION:  GENERAL:  74 y.o.-year-old patient lying in the bed with no acute distress.  EYES: Pupils equal, round, reactive to light and accommodation. No scleral icterus. Extraocular muscles intact.  HEENT: Head atraumatic, normocephalic. Oropharynx and nasopharynx clear.  NECK:  Supple, no jugular venous distention. No thyroid enlargement, no tenderness.  LUNGS: Normal breath sounds bilaterally, no wheezing, rales,rhonchi or crepitation. No use of accessory muscles of respiration.  CARDIOVASCULAR: S1, S2 normal. No murmurs, rubs, or gallops.  ABDOMEN: Soft, nontender, nondistended. Bowel sounds present.  No organomegaly or mass.  EXTREMITIES: No pedal edema, cyanosis, or clubbing.  NEUROLOGIC: Cranial nerves II through XII are intact. Muscle strength 5/5 in all extremities. Sensation intact. Gait not checked.  PSYCHIATRIC: The patient is alert and oriented x 3.  SKIN: No obvious rash, lesion, or ulcer.   Physical Exam LABORATORY PANEL:   CBC  Recent Labs Lab 11/09/14 0513  WBC 8.6  HGB 11.5*  HCT 33.3*  PLT 164   ------------------------------------------------------------------------------------------------------------------  Chemistries   Recent Labs Lab 11/08/14 1629 11/09/14 0513  NA 132* 136  K 6.0* 6.4*  CL 105 113*  CO2 18* 19*  GLUCOSE 177* 124*  BUN 143* 120*  CREATININE <0.30* 2.10*  CALCIUM 8.6* 8.6*  AST 19  --   ALT 15*  --   ALKPHOS 47  --   BILITOT 0.6  --    ------------------------------------------------------------------------------------------------------------------  Cardiac Enzymes  Recent Labs Lab 11/08/14 1934 11/09/14 0513  TROPONINI 0.04* 3.12*   ------------------------------------------------------------------------------------------------------------------  RADIOLOGY:  Dg Chest Portable 1 View  11/08/2014   CLINICAL DATA:  GI bleeding, smoker  EXAM: PORTABLE CHEST 1 VIEW  COMPARISON:  Portable chest x-ray of January 31, 2012  FINDINGS: The lungs are adequately inflated. There is no focal infiltrate. There is a small left pleural effusion. The heart is normal in size. The pulmonary vascularity is not engorged. There are post CABG changes.  IMPRESSION: There is no evidence of pneumonia. A small left pleural effusion is suspected. When the patient can tolerate the procedure, a PA and lateral chest x-ray would be of value.   Electronically Signed   By: David  Martinique M.D.   On:  11/08/2014 17:11    ASSESSMENT AND PLAN:   Active Problems:   Upper GI bleed   Acute upper GI bleed   Acute esophagitis   Gastric peptic ulcer   *   Upper GI bleed; suspect due to NSAIDs,  received transfusion emergently, hb stable now.  continue Protonix drip and octreotide .   Appreciated prompt response by GI- s/p EGD 11/08/14- Showed gastric ulcer.    * Coronary artery disease with chest pain   NSTEMI- as trononin had a big rise.   Not a candidate for IV heparin for now- due to GI bleed.   Cardiac monitoring and cardiology consult.   ASA and Plavix on hold due to same.  * Acute renal failure   IV fluids and monitor.   Avoid nephrotoxic drugs.   Renal sonogram.    Nephrology consult.  * Hyperkalemia    Likely due to above.    IV calcium gluconate, IV Insulin and Glucose, albuterol nebs.    Monitor On tele.     * History of diabetes not on any medication currently due to weight loss: Sliding scale for now  * Hypertension: Currently normotensive   held blood pressure medications due to hypotension on presentation.  * Hyperlipidemia in light of nothing by mouth state will hold oral meds for now  * Miscellaneous no anticoagulation for DVT prophylaxis due to patient's active GI bleed   All the records are reviewed and case discussed with Care Management/Social Workerr. Management plans discussed with the patient, family and they are in agreement.  CODE STATUS: FUll.  TOTAL TIME TAKING CARE OF THIS PATIENT: 40 minutes critical care.   Critical due to GI Bled and now NSTEMI, renal failure and hyperkalemia.  POSSIBLE D/C IN 2-3 DAYS, DEPENDING ON CLINICAL CONDITION.   Vaughan Basta M.D on 11/09/2014   Between 7am to 6pm - Pager - [phone]  After 6pm go to [email or web address] - password [email or web address] [facility]  Office  [phone]  CC: Primary care physician; Otilio Miu, MD  Note: This dictation was prepared with Dragon dictation along with smaller phrase technology. Any transcriptional errors that result from this process are unintentional.

## 2014-11-09 NOTE — Anesthesia Postprocedure Evaluation (Signed)
  Anesthesia Post-op Note  Patient: Ricky Rangel  Procedure(s) Performed: Procedure(s): ESOPHAGOGASTRODUODENOSCOPY (EGD) WITH PROPOFOL (N/A)  Anesthesia type:General  Patient location: PACU  Post pain: Pain level controlled  Post assessment: Post-op Vital signs reviewed, Patient's Cardiovascular Status Stable, Respiratory Function Stable, Patent Airway and No signs of Nausea or vomiting  Post vital signs: Reviewed and stable  Last Vitals:  Filed Vitals:   11/09/14 1000  BP: 98/61  Pulse: 94  Temp:   Resp:     Level of consciousness: patient sleeping, spont breathing with Nasal cannula on, awakens, responds to commands, and oriented  Complications: No apparent anesthesia complications

## 2014-11-09 NOTE — Progress Notes (Signed)
Initial Nutrition Assessment   DOCUMENTATION CODES:  Non-severe (moderate) malnutrition in context of social or environmental circumstances   INTERVENTION:   Coordination of Care: will await diet progression as medically able Medical Food Supplement Therapy: will recommend vanilla Ensure Enlive po BID, each supplement provides 350 kcal and 20 grams of protein, when diet advanced    NUTRITION DIAGNOSIS:   Inadequate oral intake related to inability to eat as evidenced by NPO status.  GOAL:   Patient will meet greater than or equal to 90% of their needs  MONITOR:    (Energy Intake, Digestive System, Anthropometrics, Electrolyte and renal Profile)  REASON FOR ASSESSMENT:   Malnutrition Screening Tool    ASSESSMENT:   Pt admitted with GI bleed s/p emergent endoscopy with cauterization of gastric ulcers per MD note.  Past Medical History  Diagnosis Date  . CAD (coronary artery disease)   . MI (myocardial infarction)   . PVD (peripheral vascular disease)   . DM (diabetes mellitus)   . CVA (cerebral infarction)   . HTN (hypertension)   . TIA (transient ischemic attack)      Diet Order:  Diet NPO time specified   Current Nutrition: Pt NPO  Food/Nutrition-Related History: Pt reports poor appetite since divorce one year ago. Pt reports usually eating 1 meal per day, sometimes 2, PTA. Pt reports drinking 2 Boosts a day PTA.   Medications: NS at 121mL/hr, Protonix, D50 bolus given, Calcium gluconate given  Electrolyte/Renal Profile and Glucose Profile:   Recent Labs Lab 11/08/14 1629 11/09/14 0513  NA 132* 136  K 6.0* 6.4*  CL 105 113*  CO2 18* 19*  BUN 143* 120*  CREATININE <0.30* 2.10*  CALCIUM 8.6* 8.6*  GLUCOSE 177* 124*   Protein Profile:   Recent Labs Lab 11/08/14 1629  ALBUMIN 3.2*    Gastrointestinal Profile: Last BM:  11/09/2014   Nutrition-Focused Physical Exam Findings: Nutrition-Focused physical exam completed. Findings are no fat  depletion, mild-moderate muscle depletion, and no edema.    Weight Change: Pt reports weight loss of 50lbs in the past year since his divorce (26% weight loss in one year per report). Per [facility], 17% weight loss in 6 months.   Skin:  Reviewed, no issues   Height:   Ht Readings from Last 1 Encounters:  11/08/14 5\' 7"  (1.702 m)    Weight:   Wt Readings from Last 1 Encounters:  11/08/14 140 lb (63.504 kg)    Wt Readings from Last 10 Encounters:  11/08/14 140 lb (63.504 kg)  09/10/14 135 lb (61.236 kg)  09/05/14 139 lb (63.05 kg)  08/05/14 144 lb (65.318 kg)  07/22/14 148 lb 5.9 oz (67.3 kg)  05/08/14 169 lb 15.6 oz (77.1 kg)    BMI:  Body mass index is 21.92 kg/(m^2).  Estimated Nutritional Needs:   Kcal:  BEE: 1318kcals, TEE: (IF 1.1-1.3)(AF 1.2) 1740-2051kcals  Protein:  51-64g protein (0.8-1.0g/kg)  Fluid:  1588-1957mL of fluid (25-63mL/kg)  EDUCATION NEEDS:   No education needs identified at this time  [place], RD, LDN Pager [phone]

## 2014-11-09 NOTE — Consult Note (Signed)
Reason for Consult: Non-STEMI elevated troponins known coronary disease Referring Physician: Otilio Miu and hospitalist Cardiologist : BK  Viviano Bir is an 74 y.o. male.  HPI: Presents with reflux heartburn known coronary disease diabetes renal insufficiency peripheral vascular disease PCI and stent in the distant past. Found to be anemic and underwent EGD scope then cautery of ulcer and has significant erosive esophagitis and duodenitis. Patient had been on Plavix for coronary disease and peripheral vascular disease as well as Naprosyn for an extended period of time. Patient also has some significant weight loss. Complains of epigastric chest discomfort that he thought was indigestion. Presented with low troponin I admission but subsequently rose to 4.5. EKG is nondiagnostic so far. Cardiology was recommended for further evaluation patient last saw his cardiologist about 3 months ago in the office.  Past Medical History  Diagnosis Date  . CAD (coronary artery disease)   . MI (myocardial infarction)   . PVD (peripheral vascular disease)   . DM (diabetes mellitus)   . CVA (cerebral infarction)   . HTN (hypertension)   . TIA (transient ischemic attack)     Past Surgical History  Procedure Laterality Date  . Carotid endarterectomy    . Coronary artery bypass graft    . Leg stent      Family History  Problem Relation Age of Onset  . CAD      Social History:  reports that he has never smoked. He has never used smokeless tobacco. He reports that he does not drink alcohol or use illicit drugs.  Allergies:  Allergies  Allergen Reactions  . No Known Allergies     Medications: I have reviewed the patient's current medications.  Results for orders placed or performed during the hospital encounter of 11/08/14 (from the past 48 hour(s))  Comprehensive metabolic panel     Status: Abnormal   Collection Time: 11/08/14  4:29 PM  Result Value Ref Range   Sodium 132 (L) 135 - 145  mmol/L   Potassium 6.0 (H) 3.5 - 5.1 mmol/L   Chloride 105 101 - 111 mmol/L   CO2 18 (L) 22 - 32 mmol/L   Glucose, Bld 177 (H) 65 - 99 mg/dL   BUN 143 (H) 6 - 20 mg/dL   Creatinine, Ser <0.30 (L) 0.61 - 1.24 mg/dL   Calcium 8.6 (L) 8.9 - 10.3 mg/dL   Total Protein 5.8 (L) 6.5 - 8.1 g/dL   Albumin 3.2 (L) 3.5 - 5.0 g/dL   AST 19 15 - 41 U/L   ALT 15 (L) 17 - 63 U/L   Alkaline Phosphatase 47 38 - 126 U/L   Total Bilirubin 0.6 0.3 - 1.2 mg/dL   GFR calc non Af Amer NOT CALCULATED >60 mL/min   GFR calc Af Amer NOT CALCULATED >60 mL/min    Comment: (NOTE) The eGFR has been calculated using the CKD EPI equation. This calculation has not been validated in all clinical situations. eGFR's persistently <60 mL/min signify possible Chronic Kidney Disease.    Anion gap 9 5 - 15  CBC WITH DIFFERENTIAL     Status: Abnormal   Collection Time: 11/08/14  4:29 PM  Result Value Ref Range   WBC 10.2 3.8 - 10.6 K/uL   RBC 3.04 (L) 4.40 - 5.90 MIL/uL   Hemoglobin 9.8 (L) 13.0 - 18.0 g/dL   HCT 28.7 (L) 40.0 - 52.0 %   MCV 94.6 80.0 - 100.0 fL   MCH 32.2 26.0 - 34.0 pg  MCHC 34.0 32.0 - 36.0 g/dL   RDW 13.4 11.5 - 14.5 %   Platelets 204 150 - 440 K/uL   Neutrophils Relative % 81 %   Neutro Abs 8.4 (H) 1.4 - 6.5 K/uL   Lymphocytes Relative 11 %   Lymphs Abs 1.1 1.0 - 3.6 K/uL   Monocytes Relative 7 %   Monocytes Absolute 0.7 0.2 - 1.0 K/uL   Eosinophils Relative 1 %   Eosinophils Absolute 0.1 0 - 0.7 K/uL   Basophils Relative 0 %   Basophils Absolute 0.0 0 - 0.1 K/uL  Lipase, blood     Status: Abnormal   Collection Time: 11/08/14  4:29 PM  Result Value Ref Range   Lipase 59 (H) 22 - 51 U/L  APTT     Status: None   Collection Time: 11/08/14  4:29 PM  Result Value Ref Range   aPTT 26 24 - 36 seconds  Protime-INR     Status: Abnormal   Collection Time: 11/08/14  4:29 PM  Result Value Ref Range   Prothrombin Time 15.4 (H) 11.4 - 15.0 seconds   INR 1.20   Type and screen     Status: None    Collection Time: 11/08/14  4:31 PM  Result Value Ref Range   ABO/RH(D) A POS    Antibody Screen NEG    Sample Expiration 11/11/2014    Unit Number [id number]    Blood Component Type RED CELLS,LR    Unit division 00    Status of Unit ISSUED,FINAL    Transfusion Status OK TO TRANSFUSE    Crossmatch Result Compatible    Unit Number [id number]    Blood Component Type RED CELLS,LR    Unit division 00    Status of Unit REL FROM [facility]    Transfusion Status OK TO TRANSFUSE    Crossmatch Result Compatible    Unit Number [id number]    Blood Component Type RED CELLS,LR    Unit division 00    Status of Unit ISSUED,FINAL    Transfusion Status OK TO TRANSFUSE    Crossmatch Result Compatible   ABO/Rh     Status: None   Collection Time: 11/08/14  4:31 PM  Result Value Ref Range   ABO/RH(D) A POS   Prepare RBC     Status: None   Collection Time: 11/08/14  4:35 PM  Result Value Ref Range   Order Confirmation ORDER PROCESSED BY BLOOD BANK   MRSA PCR Screening     Status: None   Collection Time: 11/08/14  7:15 PM  Result Value Ref Range   MRSA by PCR NEGATIVE NEGATIVE    Comment:        The GeneXpert MRSA Assay (FDA approved for NASAL specimens only), is one component of a comprehensive MRSA colonization surveillance program. It is not intended to diagnose MRSA infection nor to guide or monitor treatment for MRSA infections.   Hemoglobin     Status: Abnormal   Collection Time: 11/08/14  7:34 PM  Result Value Ref Range   Hemoglobin 10.2 (L) 13.0 - 18.0 g/dL  Troponin I     Status: Abnormal   Collection Time: 11/08/14  7:34 PM  Result Value Ref Range   Troponin I 0.04 (H) <0.031 ng/mL    Comment: READ BACK AND VERIFIED WITH DALE HOPKINS AT 2024 11/08/14.PMH        PERSISTENTLY INCREASED TROPONIN VALUES IN THE RANGE OF 0.04-0.49 ng/mL CAN BE SEEN IN:       -  UNSTABLE ANGINA       -CONGESTIVE HEART FAILURE       -MYOCARDITIS       -CHEST TRAUMA       -ARRYHTHMIAS        -LATE PRESENTING MYOCARDIAL INFARCTION       -COPD   CLINICAL FOLLOW-UP RECOMMENDED.   Hemoglobin     Status: Abnormal   Collection Time: 11/08/14 11:15 PM  Result Value Ref Range   Hemoglobin 10.0 (L) 13.0 - 18.0 g/dL  Glucose, capillary     Status: Abnormal   Collection Time: 11/09/14  3:52 AM  Result Value Ref Range   Glucose-Capillary 111 (H) 65 - 99 mg/dL  CBC     Status: Abnormal   Collection Time: 11/09/14  5:13 AM  Result Value Ref Range   WBC 8.6 3.8 - 10.6 K/uL   RBC 3.62 (L) 4.40 - 5.90 MIL/uL   Hemoglobin 11.5 (L) 13.0 - 18.0 g/dL   HCT 33.3 (L) 40.0 - 52.0 %   MCV 92.0 80.0 - 100.0 fL   MCH 31.7 26.0 - 34.0 pg   MCHC 34.5 32.0 - 36.0 g/dL   RDW 13.9 11.5 - 14.5 %   Platelets 164 150 - 440 K/uL    Comment: COUNT MAY BE INACCURATE DUE TO FIBRIN CLUMPS.  Basic metabolic panel     Status: Abnormal   Collection Time: 11/09/14  5:13 AM  Result Value Ref Range   Sodium 136 135 - 145 mmol/L   Potassium 6.4 (H) 3.5 - 5.1 mmol/L   Chloride 113 (H) 101 - 111 mmol/L   CO2 19 (L) 22 - 32 mmol/L   Glucose, Bld 124 (H) 65 - 99 mg/dL   BUN 120 (H) 6 - 20 mg/dL    Comment: RESULTS CONFIRMED BY MANUAL DILUTION   Creatinine, Ser 2.10 (H) 0.61 - 1.24 mg/dL   Calcium 8.6 (L) 8.9 - 10.3 mg/dL   GFR calc non Af Amer 29 (L) >60 mL/min   GFR calc Af Amer 34 (L) >60 mL/min    Comment: (NOTE) The eGFR has been calculated using the CKD EPI equation. This calculation has not been validated in all clinical situations. eGFR's persistently <60 mL/min signify possible Chronic Kidney Disease.    Anion gap 4 (L) 5 - 15  Troponin I     Status: Abnormal   Collection Time: 11/09/14  5:13 AM  Result Value Ref Range   Troponin I 3.12 (H) <0.031 ng/mL    Comment: READ BACK AND VERIFIED BY DEL HOPKINS AT 0559 ON 11/09/14 RWW        POSSIBLE MYOCARDIAL ISCHEMIA. SERIAL TESTING RECOMMENDED.   Glucose, capillary     Status: Abnormal   Collection Time: 11/09/14  7:46 AM  Result Value Ref  Range   Glucose-Capillary 121 (H) 65 - 99 mg/dL  Troponin I     Status: Abnormal   Collection Time: 11/09/14  9:21 AM  Result Value Ref Range   Troponin I 4.58 (H) <0.031 ng/mL    Comment: READ BACK AND VERIFIED WITH ERIN MAYNOR AT 1001 11/09/14 DAS        POSSIBLE MYOCARDIAL ISCHEMIA. SERIAL TESTING RECOMMENDED.   Glucose, capillary     Status: Abnormal   Collection Time: 11/09/14  9:43 AM  Result Value Ref Range   Glucose-Capillary 175 (H) 65 - 99 mg/dL    Dg Chest Portable 1 View  11/08/2014   CLINICAL DATA:  GI bleeding, smoker  EXAM: PORTABLE CHEST 1  VIEW  COMPARISON:  Portable chest x-ray of January 31, 2012  FINDINGS: The lungs are adequately inflated. There is no focal infiltrate. There is a small left pleural effusion. The heart is normal in size. The pulmonary vascularity is not engorged. There are post CABG changes.  IMPRESSION: There is no evidence of pneumonia. A small left pleural effusion is suspected. When the patient can tolerate the procedure, a PA and lateral chest x-ray would be of value.   Electronically Signed   By: David  Martinique M.D.   On: 11/08/2014 17:11    Review of Systems  Constitutional: Positive for weight loss and malaise/fatigue.  HENT: Positive for congestion.   Eyes: Negative.   Respiratory: Positive for shortness of breath.   Cardiovascular: Positive for chest pain and leg swelling.  Gastrointestinal: Positive for heartburn, nausea, abdominal pain and melena.  Genitourinary: Negative.   Musculoskeletal: Negative.   Skin: Negative.   Neurological: Positive for weakness.  Endo/Heme/Allergies: Negative.   Psychiatric/Behavioral: Positive for depression.   Blood pressure 98/61, pulse 94, temperature 98.2 F (36.8 C), temperature source Oral, resp. rate 12, height 5' 7"  (1.702 m), weight 63.504 kg (140 lb), SpO2 100 %. Physical Exam  Constitutional: He is oriented to person, place, and time. He appears well-developed and well-nourished.  HENT:   Head: Normocephalic and atraumatic.  Eyes: Conjunctivae and EOM are normal. Pupils are equal, round, and reactive to light.  Neck: Normal range of motion. Neck supple.  Cardiovascular: Normal rate and regular rhythm.   Murmur heard. Respiratory: Effort normal and breath sounds normal.  GI: Soft. Bowel sounds are normal.  Musculoskeletal: Normal range of motion.  Neurological: He is alert and oriented to person, place, and time. He has normal reflexes.  Skin: Skin is warm.  Psychiatric: He has a normal mood and affect.    Assessment/Plan: Non-STEMI Elevated troponins Coronary disease Upper GI bleed Anemia Gastric peptic ulcer Diabetes Chronic renal insufficiency Peripheral vascular disease Hyperkalemia . PLAN Agree with GI evaluation with scope Discontinue Naprosyn Discontinue Plavix No aspirin No anticoagulation at this point Support stockings for DVT prophylaxis Echocardiogram for evaluation of non-STEMI Follow-up troponins and EKGs Recommend Protonix for reflux symptoms Consider evaluation for weight loss Correct hyperkalemia Recommend nephrology evaluation for renal insufficiency Defer cardiac catheter for now  Koben Daman D. 11/09/2014, 10:40 AM

## 2014-11-10 ENCOUNTER — Inpatient Hospital Stay
Admit: 2014-11-10 | Discharge: 2014-11-10 | Disposition: A | Discharge status: Home / Self Care | Payer: Medicare Other | Attending: Internal Medicine | Admitting: Internal Medicine

## 2014-11-10 DIAGNOSIS — E440 Moderate protein-calorie malnutrition: Secondary | ICD-10-CM | POA: Insufficient documentation

## 2014-11-10 LAB — CBC
HEMATOCRIT: 34.3 % — AB (ref 40.0–52.0)
HEMOGLOBIN: 11.9 g/dL — AB (ref 13.0–18.0)
MCH: 32.2 pg (ref 26.0–34.0)
MCHC: 34.7 g/dL (ref 32.0–36.0)
MCV: 92.8 fL (ref 80.0–100.0)
Platelets: 154 10*3/uL (ref 150–440)
RBC: 3.69 MIL/uL — ABNORMAL LOW (ref 4.40–5.90)
RDW: 14.1 % (ref 11.5–14.5)
WBC: 6 10*3/uL (ref 3.8–10.6)

## 2014-11-10 LAB — TROPONIN I: Troponin I: 2.95 ng/mL — ABNORMAL HIGH (ref <0.031)

## 2014-11-10 LAB — BASIC METABOLIC PANEL
Anion gap: 4 — ABNORMAL LOW (ref 5–15)
BUN: 58 mg/dL — AB (ref 6–20)
CHLORIDE: 117 mmol/L — AB (ref 101–111)
CO2: 20 mmol/L — AB (ref 22–32)
CREATININE: 1.47 mg/dL — AB (ref 0.61–1.24)
Calcium: 8.7 mg/dL — ABNORMAL LOW (ref 8.9–10.3)
GFR calc Af Amer: 52 mL/min — ABNORMAL LOW (ref >60)
GFR calc non Af Amer: 45 mL/min — ABNORMAL LOW (ref >60)
GLUCOSE: 101 mg/dL — AB (ref 65–99)
POTASSIUM: 5.5 mmol/L — AB (ref 3.5–5.1)
Sodium: 141 mmol/L (ref 135–145)

## 2014-11-10 MED ORDER — RANITIDINE HCL 150 MG/10ML PO SYRP
150.0000 mg | ORAL_SOLUTION | Freq: Every day | ORAL | Status: DC
  Administered 2014-11-10: 150 mg via ORAL
  Filled 2014-11-10 (×2): qty 10

## 2014-11-10 MED ORDER — SODIUM CHLORIDE 0.45 % IV SOLN
INTRAVENOUS | Status: DC
  Administered 2014-11-10 – 2014-11-11 (×2): via INTRAVENOUS

## 2014-11-10 MED ORDER — SODIUM CHLORIDE 0.9 % IV SOLN: INTRAVENOUS | Status: DC

## 2014-11-10 MED ORDER — PANTOPRAZOLE SODIUM 40 MG PO TBEC
40.0000 mg | DELAYED_RELEASE_TABLET | Freq: Two times a day (BID) | ORAL | Status: DC
  Administered 2014-11-10 – 2014-11-12 (×5): 40 mg via ORAL
  Filled 2014-11-10 (×6): qty 1

## 2014-11-10 NOTE — Progress Notes (Signed)
[facility] [place] NAME: Ricky Rangel    MR#:  [id number]  DATE OF BIRTH:  [birth year]  SUBJECTIVE:  CHIEF COMPLAINT:   Chief Complaint  Patient presents with  . GI Bleeding     Had endoscopy done- showed gastric ulcer- cauterization done.   C/o chest Vs epigastric pain- Troponin elevated. He said pain is more like " Acidity"    Cardiology decided conservative management. No new complains, stable on tele.  REVIEW OF SYSTEMS:  CONSTITUTIONAL: No fever, fatigue or weakness.  EYES: No blurred or double vision.  EARS, NOSE, AND THROAT: No tinnitus or ear pain.  RESPIRATORY: No cough, shortness of breath, wheezing or hemoptysis.  CARDIOVASCULAR: positive for chest pain, no orthopnea, edema.  GASTROINTESTINAL: No nausea, vomiting, diarrhea or abdominal pain.  GENITOURINARY: No dysuria, hematuria.  ENDOCRINE: No polyuria, nocturia,  HEMATOLOGY: No anemia, easy bruising or bleeding SKIN: No rash or lesion. MUSCULOSKELETAL: No joint pain or arthritis.   NEUROLOGIC: No tingling, numbness, weakness.  PSYCHIATRY: No anxiety or depression.   ROS  DRUG ALLERGIES:   Allergies  Allergen Reactions  . No Known Allergies     VITALS:  Blood pressure 139/75, pulse 89, temperature 98.3 F (36.8 C), temperature source Oral, resp. rate 18, height 5\' 7"  (1.702 m), weight 58.559 kg (129 lb 1.6 oz), SpO2 100 %.  PHYSICAL EXAMINATION:  GENERAL:  74 y.o.-year-old patient lying in the bed with no acute distress.  EYES: Pupils equal, round, reactive to light and accommodation. No scleral icterus. Extraocular muscles intact.  HEENT: Head atraumatic, normocephalic. Oropharynx and nasopharynx clear.  NECK:  Supple, no jugular venous distention. No thyroid enlargement, no tenderness.  LUNGS: Normal breath sounds bilaterally, no wheezing, rales,rhonchi or crepitation. No use of accessory muscles of respiration.  CARDIOVASCULAR: S1, S2 normal. No  murmurs, rubs, or gallops.  ABDOMEN: Soft, nontender, nondistended. Bowel sounds present. No organomegaly or mass.  EXTREMITIES: No pedal edema, cyanosis, or clubbing.  NEUROLOGIC: Cranial nerves II through XII are intact. Muscle strength 5/5 in all extremities. Sensation intact. Gait not checked.  PSYCHIATRIC: The patient is alert and oriented x 3.  SKIN: No obvious rash, lesion, or ulcer.   Physical Exam LABORATORY PANEL:   CBC  Recent Labs Lab 11/10/14 0354  WBC 6.0  HGB 11.9*  HCT 34.3*  PLT 154   ------------------------------------------------------------------------------------------------------------------  Chemistries   Recent Labs Lab 11/08/14 1629  11/10/14 0354  NA 132*  < > 141  K 6.0*  < > 5.5*  CL 105  < > 117*  CO2 18*  < > 20*  GLUCOSE 177*  < > 101*  BUN 143*  < > 58*  CREATININE <0.30*  < > 1.47*  CALCIUM 8.6*  < > 8.7*  AST 19  --   --   ALT 15*  --   --   ALKPHOS 47  --   --   BILITOT 0.6  --   --   < > = values in this interval not displayed. ------------------------------------------------------------------------------------------------------------------  Cardiac Enzymes  Recent Labs Lab 11/09/14 2139 11/10/14 0354  TROPONINI 3.44* 2.95*   ------------------------------------------------------------------------------------------------------------------  RADIOLOGY:  US Renal  11/09/2014   CLINICAL DATA:  74 year old male with acute renal failure.  EXAM: RENAL / URINARY TRACT ULTRASOUND COMPLETE  COMPARISON:  08/05/2014 CT  FINDINGS: Right Kidney:  Length: 9.7 cm. Echogenicity within normal limits. No mass or hydronephrosis visualized. A 2 cm cyst is noted.  Left Kidney:  Length: 9.2 cm. Echogenicity within normal limits. No mass or hydronephrosis visualized.  Bladder:  Appears normal for degree of bladder distention. A cystic collection adjacent to the bladder is identified and noted to surround a portion of the penile prosthesis hardware  as seen on CT  IMPRESSION: Slightly small kidneys but no evidence of hydronephrosis. Normal renal echogenicity.   Electronically Signed   By: Margarette Canada M.D.   On: 11/09/2014 12:47   Dg Chest Portable 1 View  11/08/2014   CLINICAL DATA:  GI bleeding, smoker  EXAM: PORTABLE CHEST 1 VIEW  COMPARISON:  Portable chest x-ray of January 31, 2012  FINDINGS: The lungs are adequately inflated. There is no focal infiltrate. There is a small left pleural effusion. The heart is normal in size. The pulmonary vascularity is not engorged. There are post CABG changes.  IMPRESSION: There is no evidence of pneumonia. A small left pleural effusion is suspected. When the patient can tolerate the procedure, a PA and lateral chest x-ray would be of value.   Electronically Signed   By: David  Martinique M.D.   On: 11/08/2014 17:11    ASSESSMENT AND PLAN:   Active Problems:   Upper GI bleed   Acute upper GI bleed   Acute esophagitis   Gastric peptic ulcer   Malnutrition of moderate degree (HCC)   *  Upper GI bleed; suspect due to NSAIDs,  received transfusion emergently, hb stable now. given Protonix drip and octreotide . On Oral PPI BID now.   Appreciated prompt response by GI- s/p EGD 11/08/14- Showed gastric ulcer, cauterized.    * Coronary artery disease with chest pain   NSTEMI- as trononin had a big rise.   Not a candidate for IV heparin for now- due to GI bleed.   Cardiac monitoring and cardiology consult appreciated- decided to follow as out pt.   ASA and Plavix on hold due to same.  * Acute renal failure   IV fluids and monitor.   Avoid nephrotoxic drugs.   Renal sonogram.    Nephrology consult appreciated.  * Hyperkalemia    Likely due to above.    IV calcium gluconate, IV Insulin and Glucose, albuterol nebs.    Monitor On tele.    Still high, but came down from dangerous level yesterday.     * History of diabetes not on any medication currently due to weight loss: Sliding scale for now  *  Hypertension: Currently normotensive   held blood pressure medications due to hypotension on presentation.  * Hyperlipidemia in light of nothing by mouth state will hold oral meds for now  * Miscellaneous no anticoagulation for DVT prophylaxis due to patient's active GI bleed   All the records are reviewed and case discussed with Care Management/Social Workerr. Management plans discussed with the patient, family and they are in agreement.  CODE STATUS: FUll.  TOTAL TIME TAKING CARE OF THIS PATIENT: 30 minutes .    POSSIBLE D/C IN 2-3 DAYS, DEPENDING ON CLINICAL CONDITION.   Vaughan Basta M.D on 11/10/2014   Between 7am to 6pm - Pager - [phone]  After 6pm go to [email or web address] - password [email or web address] [facility]  Office  [phone]  CC: Primary care physician; Otilio Miu, MD  Note: This dictation was prepared with Dragon dictation along with smaller phrase technology. Any transcriptional errors that result from this process are unintentional.

## 2014-11-10 NOTE — Progress Notes (Signed)
Dr. Reece Levy notified of K level of 5.5. No new orders

## 2014-11-10 NOTE — Progress Notes (Signed)
*  PRELIMINARY RESULTS* Echocardiogram 2D Echocardiogram has been performed.  Ricky Rangel 11/10/2014, 3:36 PM

## 2014-11-10 NOTE — Progress Notes (Signed)
Subjective: Patient reports heartburn and says that the protonix is not working for him. He says that Zantac works better. No further signs of bleeding and Hb is stable.   Objective: Vital signs in last 24 hours: Filed Vitals:   11/09/14 1500 11/09/14 1526 11/09/14 1944 11/10/14 0355  BP: 103/57 120/74 121/85 144/75  Pulse: 88 93 91 89  Temp:  98.4 F (36.9 C) 98.1 F (36.7 C) 98.7 F (37.1 C)  TempSrc:  Oral Oral Oral  Resp: 14  18 18   Height:      Weight:  129 lb 1.6 oz (58.559 kg)    SpO2: 100% 100% 100% 100%   Weight change: -10 lb 14.4 oz (-4.944 kg)  Intake/Output Summary (Last 24 hours) at 11/10/14 0859 Last data filed at 11/10/14 0630  Gross per 24 hour  Intake 16246.67 ml  Output   2050 ml  Net 14196.67 ml     Exam: Regular rate and rhythm clear to auscultation soft, nontender, normal bowel sounds   Lab Results: @LABTEST2 @ Micro Results: Recent Results (from the past 240 hour(s))  MRSA PCR Screening     Status: None   Collection Time: 11/08/14  7:15 PM  Result Value Ref Range Status   MRSA by PCR NEGATIVE NEGATIVE Final    Comment:        The GeneXpert MRSA Assay (FDA approved for NASAL specimens only), is one component of a comprehensive MRSA colonization surveillance program. It is not intended to diagnose MRSA infection nor to guide or monitor treatment for MRSA infections.    Studies/Results: US Renal  11/09/2014   CLINICAL DATA:  74 year old male with acute renal failure.  EXAM: RENAL / URINARY TRACT ULTRASOUND COMPLETE  COMPARISON:  08/05/2014 CT  FINDINGS: Right Kidney:  Length: 9.7 cm. Echogenicity within normal limits. No mass or hydronephrosis visualized. A 2 cm cyst is noted.  Left Kidney:  Length: 9.2 cm. Echogenicity within normal limits. No mass or hydronephrosis visualized.  Bladder:  Appears normal for degree of bladder distention. A cystic collection adjacent to the bladder is identified and noted to surround a portion of the penile  prosthesis hardware as seen on CT  IMPRESSION: Slightly small kidneys but no evidence of hydronephrosis. Normal renal echogenicity.   Electronically Signed   By: Margarette Canada M.D.   On: 11/09/2014 12:47   Dg Chest Portable 1 View  11/08/2014   CLINICAL DATA:  GI bleeding, smoker  EXAM: PORTABLE CHEST 1 VIEW  COMPARISON:  Portable chest x-ray of January 31, 2012  FINDINGS: The lungs are adequately inflated. There is no focal infiltrate. There is a small left pleural effusion. The heart is normal in size. The pulmonary vascularity is not engorged. There are post CABG changes.  IMPRESSION: There is no evidence of pneumonia. A small left pleural effusion is suspected. When the patient can tolerate the procedure, a PA and lateral chest x-ray would be of value.   Electronically Signed   By: David  Martinique M.D.   On: 11/08/2014 17:11   Medications: I have reviewed the patient's current medications. Scheduled Meds: . sodium chloride  10 mL/hr Intravenous Once  . pantoprazole  40 mg Oral BID AC  . ranitidine  150 mg Oral QHS   Continuous Infusions: . sodium chloride 125 mL/hr at 11/09/14 1900   PRN Meds:.acetaminophen **OR** acetaminophen, ondansetron **OR** ondansetron (ZOFRAN) IV   Assessment: Active Problems:   Upper GI bleed   Acute upper GI bleed   Acute esophagitis  Gastric peptic ulcer    Plan: The Patient will have the Octriotide stopped. Switch to PO Protonix BID and start Zantac Qhs. He will also be started on a clear liquid diet.   LOS: 2 days   Daren Allen Norris 11/10/2014, 8:59 AM

## 2014-11-10 NOTE — Progress Notes (Signed)
A&O. No bloody stools. NS, sandostatin, and protonix all infusing. No complaints. 2L O2.

## 2014-11-10 NOTE — Progress Notes (Signed)
Subjective:  Clinically feels better today No acute complaints Urine output has improved to greater than 2300 cc Serum creatinine has improved down to 1.47 Potassium overall improved but still in the high range of 5.5   Objective:  Vital signs in last 24 hours:  Temp:  [98.1 F (36.7 C)-98.7 F (37.1 C)] 98.1 F (36.7 C) (10/02 0939) Pulse Rate:  [88-96] 94 (10/02 0939) Resp:  [14-21] 18 (10/02 0939) BP: (90-144)/(54-85) 138/80 mmHg (10/02 0939) SpO2:  [100 %] 100 % (10/02 0355) Weight:  [58.559 kg (129 lb 1.6 oz)] 58.559 kg (129 lb 1.6 oz) (10/01 1526)  Weight change: -4.944 kg (-10 lb 14.4 oz) Filed Weights   11/08/14 1627 11/09/14 1526  Weight: 63.504 kg (140 lb) 58.559 kg (129 lb 1.6 oz)    Intake/Output:    Intake/Output Summary (Last 24 hours) at 11/10/14 1038 Last data filed at 11/10/14 0630  Gross per 24 hour  Intake 16246.67 ml  Output   2050 ml  Net 14196.67 ml     Physical Exam: General:  no acute distress, laying in the bed   HEENT  anicteric, moist oral mucous membranes   Neck  supple, no masses   Pulm/lungs  normal effort, clear to auscultation bilaterally   CVS/Heart  no rub or gallop   Abdomen:   soft, nontender, nondistended   Extremities:  right arm swelling from infiltrated IV   Neurologic:  alert, oriented, speech normal   Skin:  no acute rashes   Access:        Basic Metabolic Panel:   Recent Labs Lab 11/08/14 1629 11/09/14 0513 11/09/14 1306 11/09/14 2139 11/10/14 0354  NA 132* 136  --   --  141  K 6.0* 6.4* 5.6* 5.9* 5.5*  CL 105 113*  --   --  117*  CO2 18* 19*  --   --  20*  GLUCOSE 177* 124*  --   --  101*  BUN 143* 120*  --   --  58*  CREATININE <0.30* 2.10*  --   --  1.47*  CALCIUM 8.6* 8.6*  --   --  8.7*     CBC:  Recent Labs Lab 11/08/14 1629 11/08/14 1934 11/08/14 2315 11/09/14 0513 11/09/14 1306 11/10/14 0354  WBC 10.2  --   --  8.6  --  6.0  NEUTROABS 8.4*  --   --   --   --   --   HGB 9.8* 10.2*  10.0* 11.5* 11.0* 11.9*  HCT 28.7*  --   --  33.3*  --  34.3*  MCV 94.6  --   --  92.0  --  92.8  PLT 204  --   --  164  --  154      Microbiology:  Recent Results (from the past 720 hour(s))  MRSA PCR Screening     Status: None   Collection Time: 11/08/14  7:15 PM  Result Value Ref Range Status   MRSA by PCR NEGATIVE NEGATIVE Final    Comment:        The GeneXpert MRSA Assay (FDA approved for NASAL specimens only), is one component of a comprehensive MRSA colonization surveillance program. It is not intended to diagnose MRSA infection nor to guide or monitor treatment for MRSA infections.     Coagulation Studies:  Recent Labs  11/08/14 1629  LABPROT 15.4*  INR 1.20    Urinalysis: No results for input(s): COLORURINE, LABSPEC, [place], GLUCOSEU, [place], [place], [place], PROTEINUR,  UROBILINOGEN, NITRITE, LEUKOCYTESUR in the last 72 hours.  Invalid input(s): APPERANCEUR    Imaging: US Renal  11/09/2014   CLINICAL DATA:  74 year old male with acute renal failure.  EXAM: RENAL / URINARY TRACT ULTRASOUND COMPLETE  COMPARISON:  08/05/2014 CT  FINDINGS: Right Kidney:  Length: 9.7 cm. Echogenicity within normal limits. No mass or hydronephrosis visualized. A 2 cm cyst is noted.  Left Kidney:  Length: 9.2 cm. Echogenicity within normal limits. No mass or hydronephrosis visualized.  Bladder:  Appears normal for degree of bladder distention. A cystic collection adjacent to the bladder is identified and noted to surround a portion of the penile prosthesis hardware as seen on CT  IMPRESSION: Slightly small kidneys but no evidence of hydronephrosis. Normal renal echogenicity.   Electronically Signed   By: Margarette Canada M.D.   On: 11/09/2014 12:47   Dg Chest Portable 1 View  11/08/2014   CLINICAL DATA:  GI bleeding, smoker  EXAM: PORTABLE CHEST 1 VIEW  COMPARISON:  Portable chest x-ray of January 31, 2012  FINDINGS: The lungs are adequately inflated. There is no focal infiltrate.  There is a small left pleural effusion. The heart is normal in size. The pulmonary vascularity is not engorged. There are post CABG changes.  IMPRESSION: There is no evidence of pneumonia. A small left pleural effusion is suspected. When the patient can tolerate the procedure, a PA and lateral chest x-ray would be of value.   Electronically Signed   By: David  Martinique M.D.   On: 11/08/2014 17:11     Medications:   . sodium chloride 125 mL/hr at 11/09/14 1900   . sodium chloride  10 mL/hr Intravenous Once  . pantoprazole  40 mg Oral BID AC  . ranitidine  150 mg Oral QHS   acetaminophen **OR** acetaminophen, ondansetron **OR** ondansetron (ZOFRAN) IV  Assessment/ Plan:  74 y.o. male with medical problems of CAD, CABG at [place] 10 yrs ago, angioplasty and stent 2 yrs ago, PVD, DM, CVA, HTN who was admitted to [facility] on 11/08/2014 for evaluation of bloody emesis.  1. Acute renal failure. Likely secondary to ATN from hemodynamic compromise due to GI bleed - Agree with volume resuscitation Currently saline at 125 cc per hour. May DC IV fluids after current bag is complete as oral intake appears to have improved 2. Hyperkalemia Likely secondary to GI bleed and blood transfusions Agree with shifting measures Hopefully with increased urine output, potassium will continue to improve No acute indication for dialysis at present 3. Acute GI bleed secondary to gastric ulcer Underwent EGD and cauterization 9/30 Blood transfusion given this admission   LOS: 2 Thayer Inabinet 10/2/201610:38 AM

## 2014-11-11 ENCOUNTER — Inpatient Hospital Stay: Payer: Medicare Other

## 2014-11-11 ENCOUNTER — Inpatient Hospital Stay: Payer: Medicare Other | Admitting: Oncology

## 2014-11-11 LAB — CBC
HCT: 31.5 % — ABNORMAL LOW (ref 40.0–52.0)
Hemoglobin: 11 g/dL — ABNORMAL LOW (ref 13.0–18.0)
MCH: 32.7 pg (ref 26.0–34.0)
MCHC: 35.1 g/dL (ref 32.0–36.0)
MCV: 93.3 fL (ref 80.0–100.0)
PLATELETS: 140 10*3/uL — AB (ref 150–440)
RBC: 3.37 MIL/uL — AB (ref 4.40–5.90)
RDW: 14.2 % (ref 11.5–14.5)
WBC: 4 10*3/uL (ref 3.8–10.6)

## 2014-11-11 LAB — BASIC METABOLIC PANEL
Anion gap: 5 (ref 5–15)
BUN: 37 mg/dL — AB (ref 6–20)
CO2: 21 mmol/L — ABNORMAL LOW (ref 22–32)
CREATININE: 1.24 mg/dL (ref 0.61–1.24)
Calcium: 8.4 mg/dL — ABNORMAL LOW (ref 8.9–10.3)
Chloride: 114 mmol/L — ABNORMAL HIGH (ref 101–111)
GFR calc Af Amer: >60 mL/min (ref >60)
GFR, EST NON AFRICAN AMERICAN: 55 mL/min — AB (ref >60)
GLUCOSE: 113 mg/dL — AB (ref 65–99)
POTASSIUM: 4.7 mmol/L (ref 3.5–5.1)
SODIUM: 140 mmol/L (ref 135–145)

## 2014-11-11 LAB — GLUCOSE, CAPILLARY
Glucose-Capillary: 140 mg/dL — ABNORMAL HIGH (ref 65–99)
Glucose-Capillary: 136 mg/dL — ABNORMAL HIGH (ref 65–99)

## 2014-11-11 MED ORDER — FAMOTIDINE 20 MG PO TABS
20.0000 mg | ORAL_TABLET | Freq: Every day | ORAL | Status: DC
  Administered 2014-11-12: 20 mg via ORAL
  Filled 2014-11-11: qty 1

## 2014-11-11 MED ORDER — ATORVASTATIN CALCIUM 20 MG PO TABS
40.0000 mg | ORAL_TABLET | Freq: Every day | ORAL | Status: DC
  Administered 2014-11-11: 40 mg via ORAL
  Filled 2014-11-11: qty 2

## 2014-11-11 MED ORDER — CARVEDILOL 3.125 MG PO TABS
3.1250 mg | ORAL_TABLET | Freq: Two times a day (BID) | ORAL | Status: DC
  Administered 2014-11-11: 3.125 mg via ORAL
  Filled 2014-11-11 (×2): qty 1

## 2014-11-11 NOTE — Progress Notes (Signed)
A&O. Resting in bed through the night. No complaints. IVF infusing. Voiding well. On RA.

## 2014-11-11 NOTE — Anesthesia Postprocedure Evaluation (Signed)
  Anesthesia Post-op Note  Patient: Ricky Rangel  Procedure(s) Performed: Procedure(s): ESOPHAGOGASTRODUODENOSCOPY (EGD) WITH PROPOFOL (N/A)  Anesthesia type:General  Patient location: PACU  Post pain: Pain level controlled  Post assessment: Post-op Vital signs reviewed, Patient's Cardiovascular Status Stable, Respiratory Function Stable, Patent Airway and No signs of Nausea or vomiting  Post vital signs: Reviewed and stable  Last Vitals:  Filed Vitals:   11/11/14 1103  BP: 117/71  Pulse: 73  Temp: 36.7 C  Resp: 18    Level of consciousness: awake, alert  and patient cooperative  Complications: No apparent anesthesia complications

## 2014-11-11 NOTE — Care Management Note (Addendum)
Case Management Note  Patient Details  Name: Ricky Rangel MRN: [id number] Date of Birth: 04-29-1940  Subjective/Objective:                 Patient admitted from home with upper GI bleed.  Patient lives at home alone.  States that he uses [facility] in [place].  Patient states that he does not have any home equipment, and drives himself to appointments.  Per PT recommendations are SNF, Rolling walker, and BSC.  Patient states he will not go to a nursing home, and refuses a BSC.  Patient says he is open to having home health come to his home, and would like a walker.  Patient states that he does not have a preference for home health agencies.  Corene Cornea from advanced home pending referral.    Action/Plan:  RN CM to follow   Expected Discharge Date:                  Expected Discharge Plan:     In-House Referral:     Discharge planning Services     Post Acute Care Choice:    Choice offered to:     DME Arranged:    DME Agency:     HH Arranged:    [place] Agency:     Status of Service:     Medicare Important Message Given:  Yes-second notification given Date Medicare IM Given:    Medicare IM give by:    Date Additional Medicare IM Given:    Additional Medicare Important Message give by:     If discussed at [place] of Stay Meetings, dates discussed:    Additional Comments:  Beverly Sessions, RN 11/11/2014, 2:24 PM

## 2014-11-11 NOTE — Progress Notes (Signed)
Subjective:  Laying in bed comfortably.  Creatinine 1.24 (1.47) Tolerating liquid diet   Objective:  Vital signs in last 24 hours:  Temp:  [97.8 F (36.6 C)-98.3 F (36.8 C)] 97.8 F (36.6 C) (10/03 0800) Pulse Rate:  [79-89] 80 (10/03 0800) Resp:  [18] 18 (10/03 0800) BP: (122-139)/(68-79) 122/68 mmHg (10/03 0800) SpO2:  [98 %-100 %] 98 % (10/03 0800)  Weight change:  Filed Weights   11/08/14 1627 11/09/14 1526  Weight: 63.504 kg (140 lb) 58.559 kg (129 lb 1.6 oz)    Intake/Output:    Intake/Output Summary (Last 24 hours) at 11/11/14 1008 Last data filed at 11/11/14 0923  Gross per 24 hour  Intake   1200 ml  Output    875 ml  Net    325 ml     Physical Exam: General:  no acute distress, laying in the bed   HEENT  anicteric, moist oral mucous membranes   Neck  supple, no masses   Pulm/lungs  normal effort, clear to auscultation bilaterally   CVS/Heart  no rub or gallop   Abdomen:   soft, nontender, nondistended   Extremities:  no edema  Neurologic:  alert, oriented, speech normal   Skin:  no acute rashes           Basic Metabolic Panel:   Recent Labs Lab 11/08/14 1629 11/09/14 0513 11/09/14 1306 11/09/14 2139 11/10/14 0354 11/11/14 0408  NA 132* 136  --   --  141 140  K 6.0* 6.4* 5.6* 5.9* 5.5* 4.7  CL 105 113*  --   --  117* 114*  CO2 18* 19*  --   --  20* 21*  GLUCOSE 177* 124*  --   --  101* 113*  BUN 143* 120*  --   --  58* 37*  CREATININE <0.30* 2.10*  --   --  1.47* 1.24  CALCIUM 8.6* 8.6*  --   --  8.7* 8.4*     CBC:  Recent Labs Lab 11/08/14 1629  11/08/14 2315 11/09/14 0513 11/09/14 1306 11/10/14 0354 11/11/14 0408  WBC 10.2  --   --  8.6  --  6.0 4.0  NEUTROABS 8.4*  --   --   --   --   --   --   HGB 9.8*  < > 10.0* 11.5* 11.0* 11.9* 11.0*  HCT 28.7*  --   --  33.3*  --  34.3* 31.5*  MCV 94.6  --   --  92.0  --  92.8 93.3  PLT 204  --   --  164  --  154 140*  < > = values in this interval not  displayed.    Microbiology:  Recent Results (from the past 720 hour(s))  MRSA PCR Screening     Status: None   Collection Time: 11/08/14  7:15 PM  Result Value Ref Range Status   MRSA by PCR NEGATIVE NEGATIVE Final    Comment:        The GeneXpert MRSA Assay (FDA approved for NASAL specimens only), is one component of a comprehensive MRSA colonization surveillance program. It is not intended to diagnose MRSA infection nor to guide or monitor treatment for MRSA infections.     Coagulation Studies:  Recent Labs  11/08/14 1629  LABPROT 15.4*  INR 1.20    Urinalysis: No results for input(s): COLORURINE, LABSPEC, PHURINE, GLUCOSEU, HGBUR, BILIRUBINUR, KETONESUR, PROTEINUR, UROBILINOGEN, NITRITE, LEUKOCYTESUR in the last 72 hours.  Invalid input(s): APPERANCEUR  Imaging: US Renal  11/09/2014   CLINICAL DATA:  74 year old male with acute renal failure.  EXAM: RENAL / URINARY TRACT ULTRASOUND COMPLETE  COMPARISON:  08/05/2014 CT  FINDINGS: Right Kidney:  Length: 9.7 cm. Echogenicity within normal limits. No mass or hydronephrosis visualized. A 2 cm cyst is noted.  Left Kidney:  Length: 9.2 cm. Echogenicity within normal limits. No mass or hydronephrosis visualized.  Bladder:  Appears normal for degree of bladder distention. A cystic collection adjacent to the bladder is identified and noted to surround a portion of the penile prosthesis hardware as seen on CT  IMPRESSION: Slightly small kidneys but no evidence of hydronephrosis. Normal renal echogenicity.   Electronically Signed   By: Margarette Canada M.D.   On: 11/09/2014 12:47     Medications:     . sodium chloride  10 mL/hr Intravenous Once  . carvedilol  3.125 mg Oral BID WC  . pantoprazole  40 mg Oral BID AC  . ranitidine  150 mg Oral QHS   acetaminophen **OR** acetaminophen, ondansetron **OR** ondansetron (ZOFRAN) IV  Assessment/ Plan:  74 y.o. white male with CAD, CABG at [place] 10 yrs ago, angioplasty and stent 2  yrs ago, PVD, DM, CVA, HTN who was admitted to [facility] on 11/08/2014 for evaluation of bloody emesis.  1. Acute renal failure. Likely secondary to ATN from hemodynamic compromise due to GI bleed - creatinine now at goal. Will discontinue IV fluids. Encourage oral intake.  - hold naproxen due to peptic ulcer disease and acute renal failure.  - holding benazepril.   2. Hyperkalemia: secondary to GI bleed and blood transfusions. Potassium now at goal.  - Continue to monitor.  3. Acute anemia with Acute GI bleed secondary to gastric ulcer. Underwent EGD and cauterization 9/30. Blood transfusion given this admission - hemoglobin stable at 11.  - Appreciate GI input.   4. Hypertension: blood pressure well controlled. Currently off blood pressrue agents: regimen of benazepril, losartan and spironolactone. Recommend restarting only one ARB.    LOS: [place], [place] 10/3/201610:08 AM

## 2014-11-11 NOTE — Evaluation (Signed)
Physical Therapy Evaluation Patient Details Name: Ricky Rangel MRN: [id number] DOB: [birth year] Today's Date: 11/11/2014   History of Present Illness  Pt was admitted to [facility] with acute anemia secondary to GIB. Pt underwent EGD with cauterization. Pt also with NSTEMI and acute renal failure. Pt with history of CAD with multiple stents. At baseline pt lives alone and reports household ambulation with single point cane. Pt reports independence with ADLs but has assistance from son with IADLs. Pt reports 1 fall in the last 12 monhts  Clinical Impression  Pt presents with considerable LE deconditioning and poor safety awareness. He is unstable on his feet requiring assistance to remain upright in standing during transfers and with gait. Poor reaction time with therapist correcting for multiple losses of balance during ambulation to prevent falls. Pt currently refuses SNF but agrees to [facility] PT. Son is available to stay with patient during daytime hours but pt is unsafe for ambulation alone at night. Nees 24/7 assistance. Recommend urinal and BSC for night time toileting. Pt refuses BSC. Agrees to [facility] PT. Pt is a high risk for falls and readmission. Pt will benefit from skilled PT services to address deficits in strength, balance, and mobility in order to return to full function at home.     Follow Up Recommendations SNF (Refuses, please arrange HH PT, 24/7 assist)    Equipment Recommendations  Rolling walker with 5" wheels;3in1 (PT) (Refuses 3 in 1 for nighttime BM, has urinal for night)    Recommendations for Other Services       Precautions / Restrictions Precautions Precautions: Fall Restrictions Weight Bearing Restrictions: No      Mobility  Bed Mobility Overal bed mobility: Needs Assistance Bed Mobility: Supine to Sit;Sit to Supine     Supine to sit: Min assist Sit to supine: Supervision   General bed mobility comments: Poor UE strength noted. [place] with poor static  sitting balance initially but improves over time  Transfers Overall transfer level: Needs assistance Equipment used: Rolling walker (2 wheeled) Transfers: Sit to/from Stand Sit to Stand: Min assist         General transfer comment: Pt with poor LE power requiring UE assist to support himself. Attempted 5TSTS but pt unable to perform sit to stand without UE support. Poor anterior weight shift and poor balance in standing with minA+1 required to remian upright on feet. Pt uses bed on back of knees for support  Ambulation/Gait Ambulation/Gait assistance: Min assist Ambulation Distance (Feet): 10 Feet Assistive device: Rolling walker (2 wheeled) Gait Pattern/deviations: Shuffle   Gait velocity interpretation: <1.8 ft/sec, indicative of risk for recurrent falls General Gait Details: Pt with poor standing balance requiring at least 2 episodes where therapist required to correct for loss of balance and prevent fall during ambulation. Bilateral LE crouched posture with poor safety awareness and impaired reaction time to perturbations. Overall pt is a high fall risk  Stairs            Wheelchair Mobility    Modified Rankin (Stroke Patients Only)       Balance Overall balance assessment: Needs assistance   Sitting balance-Leahy Scale: Fair       Standing balance-Leahy Scale: Poor                               Pertinent Vitals/Pain Pain Assessment: No/denies pain    Home Living Family/patient expects to be discharged to:: Private residence Living Arrangements:  Alone Available Help at Discharge: Family (Son) Type of Home: House Home Access: Stairs to enter Entrance Stairs-Rails: Psychiatric nurse of Steps: 3 Home Layout: Able to live on main level with bedroom/bathroom Home Equipment: Shower seat;Cane - single point;Grab bars - toilet;Grab bars - tub/shower (No BSC, no walker, no hospital bed, no wheelchair)      Prior Function Level of  Independence: Needs assistance   Gait / Transfers Assistance Needed: Independent  ADL's / Homemaking Assistance Needed: Pt reports independence, medical record indicates pt needs assistance  Comments: Son helps with grocery shopping, pt drives but mostly stays at home     Hand Dominance   Dominant Hand: Right    Extremity/Trunk Assessment   Upper Extremity Assessment: RUE deficits/detail RUE Deficits / Details: Limited R shoulder flexion with 4-/5 strength in available range. This is not painful and appears chronic but pt is unable to confirm/refute prior history of R shoulder problems. Otherwise bilateral UE strength grossly WFL.          Lower Extremity Assessment: Generalized weakness (4 to 4+/5, poor sit to stand, requires UE assistance)         Communication   Communication: No difficulties  Cognition Arousal/Alertness: Awake/alert Behavior During Therapy: WFL for tasks assessed/performed Overall Cognitive Status: Within Functional Limits for tasks assessed (Pt is AOx2. Reports year is 2006 but able to recall month)                      General Comments      Exercises General Exercises - Lower Extremity Long Arc Quad: Strengthening;10 reps;Both;Seated Heel Slides: Strengthening;Both;10 reps;Seated Hip ABduction/ADduction: Strengthening;Both;10 reps;Seated Hip Flexion/Marching: Strengthening;Both;10 reps;Seated Heel Raises: Strengthening;Both;10 reps;Seated      Assessment/Plan    PT Assessment Patient needs continued PT services  PT Diagnosis Difficulty walking;Abnormality of gait;Generalized weakness   PT Problem List Decreased strength;Decreased balance;Decreased mobility;Decreased knowledge of use of DME;Decreased safety awareness  PT Treatment Interventions DME instruction;Gait training;Stair training;Therapeutic activities;Therapeutic exercise;Balance training;Neuromuscular re-education;Cognitive remediation;Patient/family education   PT Goals  (Current goals can be found in the Care Plan section) Acute Rehab PT Goals Patient Stated Goal: "I'm heading home" PT Goal Formulation: With patient/family Time For Goal Achievement: 11/25/14 Potential to Achieve Goals: Fair    Frequency Min 2X/week   Barriers to discharge Decreased caregiver support Son available to support during waking hours, no support at night    Co-evaluation               End of Session Equipment Utilized During Treatment: Gait belt Activity Tolerance: Patient tolerated treatment well Patient left: in bed;with call bell/phone within reach;with bed alarm set;with family/visitor present (Refuses up to recliner, agrees to sit in Librarian, academic) Nurse Communication: Mobility status (Up to recliner later this PM)         Time: 1340-1406 PT Time Calculation (min) (ACUTE ONLY): 26 min   Charges:   PT Evaluation $Initial PT Evaluation Tier I: 1 Procedure PT Treatments $Therapeutic Exercise: 8-22 mins   PT G Codes:       Lyndel Safe Huprich PT, DPT   Huprich,Jason 11/11/2014, 3:41 PM

## 2014-11-11 NOTE — Progress Notes (Signed)
[place] at [place] NAME: Ricky Rangel    MR#:  [id number]  DATE OF BIRTH:  [birth year]  SUBJECTIVE:  CHIEF COMPLAINT:   Chief Complaint  Patient presents with  . GI Bleeding     Had endoscopy done- showed gastric ulcer- cauterization done.   C/o chest Vs epigastric pain- Troponin elevated. He said pain is more like " Acidity"    Cardiology decided conservative management. No new complains, stable on tele.    Tolerated clear liquids. Weak on walking.  REVIEW OF SYSTEMS:  CONSTITUTIONAL: No fever, fatigue or weakness.  EYES: No blurred or double vision.  EARS, NOSE, AND THROAT: No tinnitus or ear pain.  RESPIRATORY: No cough, shortness of breath, wheezing or hemoptysis.  CARDIOVASCULAR: positive for chest pain, no orthopnea, edema.  GASTROINTESTINAL: No nausea, vomiting, diarrhea or abdominal pain.  GENITOURINARY: No dysuria, hematuria.  ENDOCRINE: No polyuria, nocturia,  HEMATOLOGY: No anemia, easy bruising or bleeding SKIN: No rash or lesion. MUSCULOSKELETAL: No joint pain or arthritis.   NEUROLOGIC: No tingling, numbness, weakness.  PSYCHIATRY: No anxiety or depression.   ROS  DRUG ALLERGIES:   Allergies  Allergen Reactions  . No Known Allergies     VITALS:  Blood pressure 117/71, pulse 73, temperature 98.1 F (36.7 C), temperature source Oral, resp. rate 18, height 5\' 7"  (1.702 m), weight 58.559 kg (129 lb 1.6 oz), SpO2 99 %.  PHYSICAL EXAMINATION:  GENERAL:  74 y.o.-year-old patient lying in the bed with no acute distress.  EYES: Pupils equal, round, reactive to light and accommodation. No scleral icterus. Extraocular muscles intact.  HEENT: Head atraumatic, normocephalic. Oropharynx and nasopharynx clear.  NECK:  Supple, no jugular venous distention. No thyroid enlargement, no tenderness.  LUNGS: Normal breath sounds bilaterally, no wheezing, rales,rhonchi or crepitation. No use of accessory muscles of  respiration.  CARDIOVASCULAR: S1, S2 normal. No murmurs, rubs, or gallops.  ABDOMEN: Soft, nontender, nondistended. Bowel sounds present. No organomegaly or mass.  EXTREMITIES: No pedal edema, cyanosis, or clubbing.  NEUROLOGIC: Cranial nerves II through XII are intact. Muscle strength 5/5 in all extremities. Sensation intact. Gait not checked.  PSYCHIATRIC: The patient is alert and oriented x 3.  SKIN: No obvious rash, lesion, or ulcer.   Physical Exam LABORATORY PANEL:   CBC  Recent Labs Lab 11/11/14 0408  WBC 4.0  HGB 11.0*  HCT 31.5*  PLT 140*   ------------------------------------------------------------------------------------------------------------------  Chemistries   Recent Labs Lab 11/08/14 1629  11/11/14 0408  NA 132*  < > 140  K 6.0*  < > 4.7  CL 105  < > 114*  CO2 18*  < > 21*  GLUCOSE 177*  < > 113*  BUN 143*  < > 37*  CREATININE <0.30*  < > 1.24  CALCIUM 8.6*  < > 8.4*  AST 19  --   --   ALT 15*  --   --   ALKPHOS 47  --   --   BILITOT 0.6  --   --   < > = values in this interval not displayed. ------------------------------------------------------------------------------------------------------------------  Cardiac Enzymes  Recent Labs Lab 11/09/14 2139 11/10/14 0354  TROPONINI 3.44* 2.95*   ------------------------------------------------------------------------------------------------------------------  RADIOLOGY:  No results found.  ASSESSMENT AND PLAN:   Active Problems:   Upper GI bleed   Acute upper GI bleed   Acute esophagitis   Gastric peptic ulcer   Malnutrition of moderate degree (HCC)   *  Upper GI bleed;  suspect due to NSAIDs, with acute anemia of blood loss.  received transfusion emergently, hb stable now. given Protonix drip and octreotide . On Oral PPI BID now.   Appreciated prompt response by GI- s/p EGD 11/08/14- Showed gastric ulcer, cauterized.   Slowly upgrading diet now. Avoid ASA+ Plavix.    * Coronary  artery disease with chest pain   NSTEMI- as trononin had a big rise.   Not a candidate for IV heparin for now- due to GI bleed.   Cardiac monitoring and cardiology consult appreciated- decided to follow as out pt.   ASA and Plavix on hold due to same.  * Acute renal failure   IV fluids and monitor.   Avoid nephrotoxic drugs.   Renal sonogram.    Nephrology consult appreciated.    Improving renal function.  * Hyperkalemia    Likely due to above.    IV calcium gluconate, IV Insulin and Glucose, albuterol nebs.    Monitor On tele.    Came down to normal with improvement in renal function.     * History of diabetes not on any medication currently due to weight loss: Sliding scale for now  * Hypertension: Currently normotensive.   held blood pressure medications due to hypotension on presentation.   Now started coreg.  * Hyperlipidemia- resume atorvastatin.  * Miscellaneous no anticoagulation for DVT prophylaxis due to patient's active GI bleed   All the records are reviewed and case discussed with Care Management/Social Workerr. Management plans discussed with the patient, family and they are in agreement.  CODE STATUS: FUll.  TOTAL TIME TAKING CARE OF THIS PATIENT: 30 minutes .   POSSIBLE D/C IN 2-3 DAYS, DEPENDING ON CLINICAL CONDITION. Need PT eval due to weakness.  Vaughan Basta M.D on 11/11/2014   Between 7am to 6pm - Pager - [phone]  After 6pm go to [email or web address] - password [email or web address] [facility]  Office  [phone]  CC: Primary care physician; Otilio Miu, MD  Note: This dictation was prepared with Dragon dictation along with smaller phrase technology. Any transcriptional errors that result from this process are unintentional.

## 2014-11-11 NOTE — Progress Notes (Signed)
Attempted to ambulate patient. Pt very weak and c/o poor balance. Uses cane at home. Rarely goes out of home. Notified dr. Anselm Jungling of need for phys therapy.

## 2014-11-11 NOTE — Clinical Documentation Improvement (Signed)
Internal Medicine  Can the abnormal labs findings and treatment be further specified?   Iron deficiency Anemia  Acute Blood loss Anemia  Nutritional anemia, including the nutrition or mineral deficits  Chronic Anemia, including the suspected or known cause  Anemia of chronic disease, including the associated chronic disease state  Other  Clinically Undetermined  Document any associated diagnoses/conditions.  Supporting Information: 11/08/14 H&P...in the ED he was also noticed to have a large episode of bright red blood emesis.".Marland Kitchen."nonrebreather and is receiving 2 units of emergent blood. He is started on octreotide drip and Protonix drip."..."   11/08/2014 23:15 11/09/2014 05:13 11/09/2014 13:06 11/10/2014 03:54 11/11/2014 04:08  Hemoglobin 10.0 (L) 11.5 (L) 11.0 (L) 11.9 (L) 11.0 (L)  HCT  33.3 (L)  34.3 (L) 31.5 (L)   Please exercise your independent, professional judgment when responding. A specific answer is not anticipated or expected.  Thank You,  Ermelinda Das, RN, BSN, [place] Certified Clinical Documentation Specialist [place]: Health Information Management [phone]

## 2014-11-12 LAB — GLUCOSE, CAPILLARY
GLUCOSE-CAPILLARY: 137 mg/dL — AB (ref 65–99)
Glucose-Capillary: 105 mg/dL — ABNORMAL HIGH (ref 65–99)

## 2014-11-12 LAB — BASIC METABOLIC PANEL WITH GFR
Anion gap: 5 (ref 5–15)
BUN: 28 mg/dL — ABNORMAL HIGH (ref 6–20)
CO2: 23 mmol/L (ref 22–32)
Calcium: 8.3 mg/dL — ABNORMAL LOW (ref 8.9–10.3)
Chloride: 110 mmol/L (ref 101–111)
Creatinine, Ser: 1.15 mg/dL (ref 0.61–1.24)
GFR calc Af Amer: >60 mL/min
GFR calc non Af Amer: 60 mL/min — ABNORMAL LOW
Glucose, Bld: 118 mg/dL — ABNORMAL HIGH (ref 65–99)
Potassium: 4.2 mmol/L (ref 3.5–5.1)
Sodium: 138 mmol/L (ref 135–145)

## 2014-11-12 MED ORDER — CARVEDILOL 3.125 MG PO TABS: 3.1250 mg | ORAL_TABLET | Freq: Two times a day (BID) | ORAL | Status: DC

## 2014-11-12 MED ORDER — PANTOPRAZOLE SODIUM 40 MG PO TBEC: 40.0000 mg | DELAYED_RELEASE_TABLET | Freq: Two times a day (BID) | ORAL | Status: DC

## 2014-11-12 NOTE — Discharge Summary (Addendum)
[place] at [place] NAME: Ricky Rangel    MR#:  [id number]  DATE OF BIRTH:  05-30-1940  DATE OF ADMISSION:  11/08/2014 ADMITTING PHYSICIAN: Dustin Flock, MD  DATE OF DISCHARGE: 11/12/2014  PRIMARY CARE PHYSICIAN: Otilio Miu, MD    ADMISSION DIAGNOSIS:  Acute upper GI bleed [K92.2] Hypotension, unspecified hypotension type [I95.9]  DISCHARGE DIAGNOSIS:  Active Problems:   Upper GI bleed   Acute upper GI bleed   Acute esophagitis   Gastric peptic ulcer   Malnutrition of moderate degree (HCC)    NSTEMI.   SECONDARY DIAGNOSIS:   Past Medical History  Diagnosis Date  . CAD (coronary artery disease)   . MI (myocardial infarction)   . PVD (peripheral vascular disease)   . DM (diabetes mellitus)   . CVA (cerebral infarction)   . HTN (hypertension)   . TIA (transient ischemic attack)     HOSPITAL COURSE:   * Upper GI bleed; suspect due to NSAIDs, with acute anemia of blood loss. received transfusion emergently, hb stable now. given Protonix drip and octreotide . On Oral PPI BID now.  Appreciated prompt response by GI- s/p EGD 11/08/14- Showed gastric ulcer, cauterized.  Slowly upgrading diet now. Avoid ASA+ Plavix.   * Coronary artery disease with chest pain  NSTEMI- as trononin had a big rise.  Not a candidate for IV heparin for now- due to GI bleed.  Cardiac monitoring and cardiology consult appreciated- decided to follow as out pt.  ASA and Plavix on hold due to same.  * Acute renal failure  IV fluids and monitor.  Avoid nephrotoxic drugs.  Renal sonogram.  Nephrology consult appreciated.  Improving renal function.  * Hyperkalemia  Likely due to above.  IV calcium gluconate, IV Insulin and Glucose, albuterol nebs.  Monitor On tele.  Came down to normal with improvement in renal function.   * History of diabetes not on any medication currently due to weight loss: Sliding scale  for now  * Hypertension: Currently normotensive.  held blood pressure medications due to hypotension on presentation.  Now started coreg.  * Hyperlipidemia- resume atorvastatin.  * Miscellaneous no anticoagulation for DVT prophylaxis due to patient's active GI bleed    DISCHARGE CONDITIONS:   Stable.  CONSULTS OBTAINED:  Treatment Team:  Dustin Flock, MD Lucilla Lame, MD Corey Skains, MD  DRUG ALLERGIES:   Allergies  Allergen Reactions  . No Known Allergies     DISCHARGE MEDICATIONS:   Current Discharge Medication List    START taking these medications   Details  pantoprazole (PROTONIX) 40 MG tablet Take 1 tablet (40 mg total) by mouth 2 (two) times daily before a meal. Qty: 60 tablet, Refills: 0      CONTINUE these medications which have NOT CHANGED   Details  atorvastatin (LIPITOR) 40 MG tablet TAKE 1 TABLET BY MOUTH EVERY DAY Qty: 30 tablet, Refills: 3   Associated Diagnoses: Hyperlipidemia    meclizine (ANTIVERT) 25 MG tablet Take 1 tablet (25 mg total) by mouth 3 (three) times daily as needed. Qty: 30 tablet, Refills: 0   Associated Diagnoses: Dizziness    PARoxetine (PAXIL) 10 MG tablet Take 1 tablet (10 mg total) by mouth daily. Qty: 30 tablet, Refills: 0   Associated Diagnoses: Depression      STOP taking these medications     aspirin EC 81 MG tablet      benazepril (LOTENSIN) 40 MG tablet  clopidogrel (PLAVIX) 75 MG tablet      losartan (COZAAR) 50 MG tablet      naproxen (NAPROSYN) 375 MG tablet      ranitidine (ZANTAC) 300 MG tablet      spironolactone (ALDACTONE) 25 MG tablet          DISCHARGE INSTRUCTIONS:    Follow with cardio and GI clinic. No Over the counter pain meds.   If you experience worsening of your admission symptoms, develop shortness of breath, life threatening emergency, suicidal or homicidal thoughts you must seek medical attention immediately by calling 911 or calling your MD immediately  if  symptoms less severe.  You Must read complete instructions/literature along with all the possible adverse reactions/side effects for all the Medicines you take and that have been prescribed to you. Take any new Medicines after you have completely understood and accept all the possible adverse reactions/side effects.   Please note  You were cared for by a hospitalist during your hospital stay. If you have any questions about your discharge medications or the care you received while you were in the hospital after you are discharged, you can call the unit and asked to speak with the hospitalist on call if the hospitalist that took care of you is not available. Once you are discharged, your primary care physician will handle any further medical issues. Please note that NO REFILLS for any discharge medications will be authorized once you are discharged, as it is imperative that you return to your primary care physician (or establish a relationship with a primary care physician if you do not have one) for your aftercare needs so that they can reassess your need for medications and monitor your lab values.    Today   CHIEF COMPLAINT:   Chief Complaint  Patient presents with  . GI Bleeding    HISTORY OF PRESENT ILLNESS:  Ricky Rangel  is a 74 y.o. male with a known history of coronary artery disease with history of multiple stents, peripheral vascular disease and history of CVA who is X wife went to visit him to check on him and the cat. She states that over the past year he's lost a lot of weight and has had failure to thrive. When she went to visit him today she noticed that he had bright red blood around his mouth and clots in his mouth. Due to this EMS was called. When patient arrived in the ED he was also noticed to have a large episode of bright red blood emesis. The ED physician did speak to the GI doctor on call who is planning to take the patient to the endoscopy right away. Patient currently is  on a nonrebreather and is receiving 2 units of emergent blood. He is started on octreotide drip and Protonix drip. He denies any abdominal pain but does complain of chest pain. Denies any shortness of breath fevers or chills  VITAL SIGNS:  Blood pressure 91/51, pulse 70, temperature 98.3 F (36.8 C), temperature source Oral, resp. rate 18, height 5\' 7"  (1.702 m), weight 58.559 kg (129 lb 1.6 oz), SpO2 98 %.  I/O:    Intake/Output Summary (Last 24 hours) at 11/12/14 1202 Last data filed at 11/12/14 0618  Gross per 24 hour  Intake   1350 ml  Output    625 ml  Net    725 ml    PHYSICAL EXAMINATION:   GENERAL: 74 y.o.-year-old patient lying in the bed with no acute distress.  EYES: Pupils equal, round, reactive to light and accommodation. No scleral icterus. Extraocular muscles intact.  HEENT: Head atraumatic, normocephalic. Oropharynx and nasopharynx clear.  NECK: Supple, no jugular venous distention. No thyroid enlargement, no tenderness.  LUNGS: Normal breath sounds bilaterally, no wheezing, rales,rhonchi or crepitation. No use of accessory muscles of respiration.  CARDIOVASCULAR: S1, S2 normal. No murmurs, rubs, or gallops.  ABDOMEN: Soft, nontender, nondistended. Bowel sounds present. No organomegaly or mass.  EXTREMITIES: No pedal edema, cyanosis, or clubbing.  NEUROLOGIC: Cranial nerves II through XII are intact. Muscle strength 5/5 in all extremities. Sensation intact. Gait not checked.  PSYCHIATRIC: The patient is alert and oriented x 3.  SKIN: No obvious rash, lesion, or ulcer.   DATA REVIEW:   CBC  Recent Labs Lab 11/11/14 0408  WBC 4.0  HGB 11.0*  HCT 31.5*  PLT 140*    Chemistries   Recent Labs Lab 11/08/14 1629  11/12/14 0418  NA 132*  < > 138  K 6.0*  < > 4.2  CL 105  < > 110  CO2 18*  < > 23  GLUCOSE 177*  < > 118*  BUN 143*  < > 28*  CREATININE <0.30*  < > 1.15  CALCIUM 8.6*  < > 8.3*  AST 19  --   --   ALT 15*  --   --   ALKPHOS 47   --   --   BILITOT 0.6  --   --   < > = values in this interval not displayed.  Cardiac Enzymes  Recent Labs Lab 11/10/14 0354  TROPONINI 2.95*    Microbiology Results  Results for orders placed or performed during the hospital encounter of 11/08/14  MRSA PCR Screening     Status: None   Collection Time: 11/08/14  7:15 PM  Result Value Ref Range Status   MRSA by PCR NEGATIVE NEGATIVE Final    Comment:        The GeneXpert MRSA Assay (FDA approved for NASAL specimens only), is one component of a comprehensive MRSA colonization surveillance program. It is not intended to diagnose MRSA infection nor to guide or monitor treatment for MRSA infections.     RADIOLOGY:  No results found.  EKG:   Orders placed or performed during the hospital encounter of 11/08/14  . EKG 12-Lead  . EKG 12-Lead  . EKG 12-Lead  . EKG 12-Lead  . EKG 12-Lead  . EKG 12-Lead      Management plans discussed with the patient, family and they are in agreement.  CODE STATUS:     Code Status Orders        Start     Ordered   11/08/14 1712  Full code   Continuous     11/08/14 1712      TOTAL TIME TAKING CARE OF THIS PATIENT: 40 minutes.    Vaughan Basta M.D on 11/12/2014 at 12:02 PM  Between 7am to 6pm - Pager - [phone]  After 6pm go to [email or web address] - password [email or web address] [facility]  Office  [phone]  CC: Primary care physician; Otilio Miu, MD   Note: This dictation was prepared with Dragon dictation along with smaller phrase technology. Any transcriptional errors that result from this process are unintentional.

## 2014-11-12 NOTE — Progress Notes (Signed)
Patient discharged home with home health, via wheelchair. IV discontinued without incident. Home medication list, any prescriptions and follow-up appointment information given. Reviewed home med list with patient. Education given on new medications, written and verbal. Pt verbalized understanding.

## 2014-11-12 NOTE — Care Management (Signed)
Patient to be discharged to home today.  Home health order and face to face entered by MD .  Corene Cornea from [facility] notified.  Order placed for rolling walker.  Will from [facility] notified.  To be delivered to room.  No further RNCM identified.  RNCM signing off.

## 2014-11-12 NOTE — Progress Notes (Signed)
MD nofitied. Held Pts morning dose of coreg 2/2 SBP<110. MD acknowledged. Will continue to assess.

## 2014-11-12 NOTE — Discharge Instructions (Signed)
Soft diet for 1 week, No spicey, Oily food. Do NOT take over the counter pain medications. Need to STOP taking Aspirin and Plavix Until you see cardiologist and Gastroenterologist in office.

## 2014-11-12 NOTE — Care Management (Signed)
Patient to be discharged today with [place], PT, and SW.  Patient's walker has been delivered to room.  Corene Cornea from [facility] notified of discharge.  RNCM signing off

## 2014-11-13 DIAGNOSIS — M6281 Muscle weakness (generalized): Secondary | ICD-10-CM | POA: Diagnosis not present

## 2014-11-14 DIAGNOSIS — K209 Esophagitis, unspecified: Secondary | ICD-10-CM | POA: Diagnosis not present

## 2014-11-14 DIAGNOSIS — K922 Gastrointestinal hemorrhage, unspecified: Secondary | ICD-10-CM | POA: Diagnosis not present

## 2014-11-14 DIAGNOSIS — I10 Essential (primary) hypertension: Secondary | ICD-10-CM | POA: Diagnosis not present

## 2014-11-14 DIAGNOSIS — I2510 Atherosclerotic heart disease of native coronary artery without angina pectoris: Secondary | ICD-10-CM | POA: Diagnosis not present

## 2014-11-14 DIAGNOSIS — E440 Moderate protein-calorie malnutrition: Secondary | ICD-10-CM | POA: Diagnosis not present

## 2014-11-14 DIAGNOSIS — E1151 Type 2 diabetes mellitus with diabetic peripheral angiopathy without gangrene: Secondary | ICD-10-CM | POA: Diagnosis not present

## 2014-11-18 ENCOUNTER — Other Ambulatory Visit: Payer: Self-pay

## 2014-11-18 DIAGNOSIS — K219 Gastro-esophageal reflux disease without esophagitis: Secondary | ICD-10-CM

## 2014-11-18 MED ORDER — PANTOPRAZOLE SODIUM 40 MG PO TBEC: 40.0000 mg | DELAYED_RELEASE_TABLET | Freq: Two times a day (BID) | ORAL | Status: AC

## 2014-11-19 ENCOUNTER — Other Ambulatory Visit: Payer: Self-pay

## 2014-11-19 DIAGNOSIS — I10 Essential (primary) hypertension: Secondary | ICD-10-CM | POA: Diagnosis not present

## 2014-11-19 DIAGNOSIS — E1151 Type 2 diabetes mellitus with diabetic peripheral angiopathy without gangrene: Secondary | ICD-10-CM | POA: Diagnosis not present

## 2014-11-19 DIAGNOSIS — K922 Gastrointestinal hemorrhage, unspecified: Secondary | ICD-10-CM | POA: Diagnosis not present

## 2014-11-19 DIAGNOSIS — E440 Moderate protein-calorie malnutrition: Secondary | ICD-10-CM | POA: Diagnosis not present

## 2014-11-19 DIAGNOSIS — K209 Esophagitis, unspecified: Secondary | ICD-10-CM | POA: Diagnosis not present

## 2014-11-19 DIAGNOSIS — I2510 Atherosclerotic heart disease of native coronary artery without angina pectoris: Secondary | ICD-10-CM | POA: Diagnosis not present

## 2014-11-21 DIAGNOSIS — E1151 Type 2 diabetes mellitus with diabetic peripheral angiopathy without gangrene: Secondary | ICD-10-CM | POA: Diagnosis not present

## 2014-11-21 DIAGNOSIS — K922 Gastrointestinal hemorrhage, unspecified: Secondary | ICD-10-CM | POA: Diagnosis not present

## 2014-11-21 DIAGNOSIS — I10 Essential (primary) hypertension: Secondary | ICD-10-CM | POA: Diagnosis not present

## 2014-11-21 DIAGNOSIS — K209 Esophagitis, unspecified: Secondary | ICD-10-CM | POA: Diagnosis not present

## 2014-11-21 DIAGNOSIS — E440 Moderate protein-calorie malnutrition: Secondary | ICD-10-CM | POA: Diagnosis not present

## 2014-11-21 DIAGNOSIS — I2510 Atherosclerotic heart disease of native coronary artery without angina pectoris: Secondary | ICD-10-CM | POA: Diagnosis not present

## 2014-11-22 ENCOUNTER — Inpatient Hospital Stay: Payer: Self-pay | Admitting: Family Medicine

## 2014-11-25 ENCOUNTER — Ambulatory Visit: Payer: Medicare Other | Attending: Radiation Oncology | Admitting: Radiation Oncology

## 2014-11-25 ENCOUNTER — Ambulatory Visit: Payer: Medicare Other | Admitting: Radiation Oncology

## 2014-11-26 ENCOUNTER — Ambulatory Visit: Payer: Medicare Other | Admitting: Gastroenterology

## 2014-11-26 ENCOUNTER — Other Ambulatory Visit: Payer: Self-pay

## 2014-11-26 DIAGNOSIS — E440 Moderate protein-calorie malnutrition: Secondary | ICD-10-CM | POA: Diagnosis not present

## 2014-11-26 DIAGNOSIS — K922 Gastrointestinal hemorrhage, unspecified: Secondary | ICD-10-CM | POA: Diagnosis not present

## 2014-11-26 DIAGNOSIS — I2510 Atherosclerotic heart disease of native coronary artery without angina pectoris: Secondary | ICD-10-CM | POA: Diagnosis not present

## 2014-11-26 DIAGNOSIS — K209 Esophagitis, unspecified: Secondary | ICD-10-CM | POA: Diagnosis not present

## 2014-11-26 DIAGNOSIS — I10 Essential (primary) hypertension: Secondary | ICD-10-CM | POA: Diagnosis not present

## 2014-11-26 DIAGNOSIS — E1151 Type 2 diabetes mellitus with diabetic peripheral angiopathy without gangrene: Secondary | ICD-10-CM | POA: Diagnosis not present

## 2014-11-28 DIAGNOSIS — K209 Esophagitis, unspecified: Secondary | ICD-10-CM | POA: Diagnosis not present

## 2014-11-28 DIAGNOSIS — E440 Moderate protein-calorie malnutrition: Secondary | ICD-10-CM | POA: Diagnosis not present

## 2014-11-28 DIAGNOSIS — K922 Gastrointestinal hemorrhage, unspecified: Secondary | ICD-10-CM | POA: Diagnosis not present

## 2014-11-28 DIAGNOSIS — E1151 Type 2 diabetes mellitus with diabetic peripheral angiopathy without gangrene: Secondary | ICD-10-CM | POA: Diagnosis not present

## 2014-11-28 DIAGNOSIS — I10 Essential (primary) hypertension: Secondary | ICD-10-CM | POA: Diagnosis not present

## 2014-11-28 DIAGNOSIS — I2510 Atherosclerotic heart disease of native coronary artery without angina pectoris: Secondary | ICD-10-CM | POA: Diagnosis not present

## 2014-11-29 ENCOUNTER — Encounter: Payer: Self-pay | Admitting: Gastroenterology

## 2014-11-29 DIAGNOSIS — I10 Essential (primary) hypertension: Secondary | ICD-10-CM | POA: Diagnosis not present

## 2014-11-29 DIAGNOSIS — K922 Gastrointestinal hemorrhage, unspecified: Secondary | ICD-10-CM | POA: Diagnosis not present

## 2014-11-29 DIAGNOSIS — I2510 Atherosclerotic heart disease of native coronary artery without angina pectoris: Secondary | ICD-10-CM | POA: Diagnosis not present

## 2014-11-29 DIAGNOSIS — E440 Moderate protein-calorie malnutrition: Secondary | ICD-10-CM | POA: Diagnosis not present

## 2014-11-29 DIAGNOSIS — E1151 Type 2 diabetes mellitus with diabetic peripheral angiopathy without gangrene: Secondary | ICD-10-CM | POA: Diagnosis not present

## 2014-11-29 DIAGNOSIS — K209 Esophagitis, unspecified: Secondary | ICD-10-CM | POA: Diagnosis not present

## 2014-12-02 DIAGNOSIS — K209 Esophagitis, unspecified: Secondary | ICD-10-CM | POA: Diagnosis not present

## 2014-12-02 DIAGNOSIS — E440 Moderate protein-calorie malnutrition: Secondary | ICD-10-CM | POA: Diagnosis not present

## 2014-12-02 DIAGNOSIS — K922 Gastrointestinal hemorrhage, unspecified: Secondary | ICD-10-CM | POA: Diagnosis not present

## 2014-12-02 DIAGNOSIS — E1151 Type 2 diabetes mellitus with diabetic peripheral angiopathy without gangrene: Secondary | ICD-10-CM | POA: Diagnosis not present

## 2014-12-02 DIAGNOSIS — I2510 Atherosclerotic heart disease of native coronary artery without angina pectoris: Secondary | ICD-10-CM | POA: Diagnosis not present

## 2014-12-02 DIAGNOSIS — I10 Essential (primary) hypertension: Secondary | ICD-10-CM | POA: Diagnosis not present

## 2014-12-03 DIAGNOSIS — E440 Moderate protein-calorie malnutrition: Secondary | ICD-10-CM | POA: Diagnosis not present

## 2014-12-03 DIAGNOSIS — I2510 Atherosclerotic heart disease of native coronary artery without angina pectoris: Secondary | ICD-10-CM | POA: Diagnosis not present

## 2014-12-03 DIAGNOSIS — K922 Gastrointestinal hemorrhage, unspecified: Secondary | ICD-10-CM | POA: Diagnosis not present

## 2014-12-03 DIAGNOSIS — E1151 Type 2 diabetes mellitus with diabetic peripheral angiopathy without gangrene: Secondary | ICD-10-CM | POA: Diagnosis not present

## 2014-12-03 DIAGNOSIS — I10 Essential (primary) hypertension: Secondary | ICD-10-CM | POA: Diagnosis not present

## 2014-12-03 DIAGNOSIS — K209 Esophagitis, unspecified: Secondary | ICD-10-CM | POA: Diagnosis not present

## 2014-12-04 DIAGNOSIS — K209 Esophagitis, unspecified: Secondary | ICD-10-CM | POA: Diagnosis not present

## 2014-12-04 DIAGNOSIS — E1151 Type 2 diabetes mellitus with diabetic peripheral angiopathy without gangrene: Secondary | ICD-10-CM | POA: Diagnosis not present

## 2014-12-04 DIAGNOSIS — K922 Gastrointestinal hemorrhage, unspecified: Secondary | ICD-10-CM | POA: Diagnosis not present

## 2014-12-04 DIAGNOSIS — I2510 Atherosclerotic heart disease of native coronary artery without angina pectoris: Secondary | ICD-10-CM | POA: Diagnosis not present

## 2014-12-04 DIAGNOSIS — E440 Moderate protein-calorie malnutrition: Secondary | ICD-10-CM | POA: Diagnosis not present

## 2014-12-04 DIAGNOSIS — I10 Essential (primary) hypertension: Secondary | ICD-10-CM | POA: Diagnosis not present

## 2014-12-05 DIAGNOSIS — I2510 Atherosclerotic heart disease of native coronary artery without angina pectoris: Secondary | ICD-10-CM | POA: Diagnosis not present

## 2014-12-05 DIAGNOSIS — K922 Gastrointestinal hemorrhage, unspecified: Secondary | ICD-10-CM | POA: Diagnosis not present

## 2014-12-05 DIAGNOSIS — E1151 Type 2 diabetes mellitus with diabetic peripheral angiopathy without gangrene: Secondary | ICD-10-CM | POA: Diagnosis not present

## 2014-12-05 DIAGNOSIS — E440 Moderate protein-calorie malnutrition: Secondary | ICD-10-CM | POA: Diagnosis not present

## 2014-12-05 DIAGNOSIS — I10 Essential (primary) hypertension: Secondary | ICD-10-CM | POA: Diagnosis not present

## 2014-12-05 DIAGNOSIS — K209 Esophagitis, unspecified: Secondary | ICD-10-CM | POA: Diagnosis not present

## 2014-12-09 DIAGNOSIS — K209 Esophagitis, unspecified: Secondary | ICD-10-CM | POA: Diagnosis not present

## 2014-12-09 DIAGNOSIS — I2510 Atherosclerotic heart disease of native coronary artery without angina pectoris: Secondary | ICD-10-CM | POA: Diagnosis not present

## 2014-12-09 DIAGNOSIS — K922 Gastrointestinal hemorrhage, unspecified: Secondary | ICD-10-CM | POA: Diagnosis not present

## 2014-12-09 DIAGNOSIS — E1151 Type 2 diabetes mellitus with diabetic peripheral angiopathy without gangrene: Secondary | ICD-10-CM | POA: Diagnosis not present

## 2014-12-09 DIAGNOSIS — I10 Essential (primary) hypertension: Secondary | ICD-10-CM | POA: Diagnosis not present

## 2014-12-09 DIAGNOSIS — E440 Moderate protein-calorie malnutrition: Secondary | ICD-10-CM | POA: Diagnosis not present

## 2014-12-10 DIAGNOSIS — K209 Esophagitis, unspecified: Secondary | ICD-10-CM | POA: Diagnosis not present

## 2014-12-10 DIAGNOSIS — I10 Essential (primary) hypertension: Secondary | ICD-10-CM | POA: Diagnosis not present

## 2014-12-10 DIAGNOSIS — I2510 Atherosclerotic heart disease of native coronary artery without angina pectoris: Secondary | ICD-10-CM | POA: Diagnosis not present

## 2014-12-10 DIAGNOSIS — K922 Gastrointestinal hemorrhage, unspecified: Secondary | ICD-10-CM | POA: Diagnosis not present

## 2014-12-10 DIAGNOSIS — E1151 Type 2 diabetes mellitus with diabetic peripheral angiopathy without gangrene: Secondary | ICD-10-CM | POA: Diagnosis not present

## 2014-12-10 DIAGNOSIS — E440 Moderate protein-calorie malnutrition: Secondary | ICD-10-CM | POA: Diagnosis not present

## 2014-12-12 DIAGNOSIS — E1151 Type 2 diabetes mellitus with diabetic peripheral angiopathy without gangrene: Secondary | ICD-10-CM | POA: Diagnosis not present

## 2014-12-12 DIAGNOSIS — I2510 Atherosclerotic heart disease of native coronary artery without angina pectoris: Secondary | ICD-10-CM | POA: Diagnosis not present

## 2014-12-12 DIAGNOSIS — I10 Essential (primary) hypertension: Secondary | ICD-10-CM | POA: Diagnosis not present

## 2014-12-12 DIAGNOSIS — K209 Esophagitis, unspecified: Secondary | ICD-10-CM | POA: Diagnosis not present

## 2014-12-12 DIAGNOSIS — E440 Moderate protein-calorie malnutrition: Secondary | ICD-10-CM | POA: Diagnosis not present

## 2014-12-12 DIAGNOSIS — K922 Gastrointestinal hemorrhage, unspecified: Secondary | ICD-10-CM | POA: Diagnosis not present

## 2014-12-13 DIAGNOSIS — E440 Moderate protein-calorie malnutrition: Secondary | ICD-10-CM | POA: Diagnosis not present

## 2014-12-13 DIAGNOSIS — K922 Gastrointestinal hemorrhage, unspecified: Secondary | ICD-10-CM | POA: Diagnosis not present

## 2014-12-13 DIAGNOSIS — E1151 Type 2 diabetes mellitus with diabetic peripheral angiopathy without gangrene: Secondary | ICD-10-CM | POA: Diagnosis not present

## 2014-12-13 DIAGNOSIS — I2510 Atherosclerotic heart disease of native coronary artery without angina pectoris: Secondary | ICD-10-CM | POA: Diagnosis not present

## 2014-12-13 DIAGNOSIS — K209 Esophagitis, unspecified: Secondary | ICD-10-CM | POA: Diagnosis not present

## 2014-12-13 DIAGNOSIS — I10 Essential (primary) hypertension: Secondary | ICD-10-CM | POA: Diagnosis not present

## 2014-12-17 DIAGNOSIS — K922 Gastrointestinal hemorrhage, unspecified: Secondary | ICD-10-CM | POA: Diagnosis not present

## 2014-12-17 DIAGNOSIS — E1151 Type 2 diabetes mellitus with diabetic peripheral angiopathy without gangrene: Secondary | ICD-10-CM | POA: Diagnosis not present

## 2014-12-17 DIAGNOSIS — I2510 Atherosclerotic heart disease of native coronary artery without angina pectoris: Secondary | ICD-10-CM | POA: Diagnosis not present

## 2014-12-17 DIAGNOSIS — K209 Esophagitis, unspecified: Secondary | ICD-10-CM | POA: Diagnosis not present

## 2014-12-17 DIAGNOSIS — I10 Essential (primary) hypertension: Secondary | ICD-10-CM | POA: Diagnosis not present

## 2014-12-17 DIAGNOSIS — E440 Moderate protein-calorie malnutrition: Secondary | ICD-10-CM | POA: Diagnosis not present

## 2014-12-19 DIAGNOSIS — K922 Gastrointestinal hemorrhage, unspecified: Secondary | ICD-10-CM | POA: Diagnosis not present

## 2014-12-19 DIAGNOSIS — E440 Moderate protein-calorie malnutrition: Secondary | ICD-10-CM | POA: Diagnosis not present

## 2014-12-19 DIAGNOSIS — K209 Esophagitis, unspecified: Secondary | ICD-10-CM | POA: Diagnosis not present

## 2014-12-19 DIAGNOSIS — E1151 Type 2 diabetes mellitus with diabetic peripheral angiopathy without gangrene: Secondary | ICD-10-CM | POA: Diagnosis not present

## 2014-12-19 DIAGNOSIS — I10 Essential (primary) hypertension: Secondary | ICD-10-CM | POA: Diagnosis not present

## 2014-12-19 DIAGNOSIS — I2510 Atherosclerotic heart disease of native coronary artery without angina pectoris: Secondary | ICD-10-CM | POA: Diagnosis not present

## 2014-12-20 ENCOUNTER — Other Ambulatory Visit: Payer: Self-pay | Admitting: Family Medicine

## 2014-12-26 DIAGNOSIS — K922 Gastrointestinal hemorrhage, unspecified: Secondary | ICD-10-CM | POA: Diagnosis not present

## 2014-12-26 DIAGNOSIS — K209 Esophagitis, unspecified: Secondary | ICD-10-CM | POA: Diagnosis not present

## 2014-12-26 DIAGNOSIS — E1151 Type 2 diabetes mellitus with diabetic peripheral angiopathy without gangrene: Secondary | ICD-10-CM | POA: Diagnosis not present

## 2014-12-26 DIAGNOSIS — E440 Moderate protein-calorie malnutrition: Secondary | ICD-10-CM | POA: Diagnosis not present

## 2014-12-26 DIAGNOSIS — I10 Essential (primary) hypertension: Secondary | ICD-10-CM | POA: Diagnosis not present

## 2014-12-26 DIAGNOSIS — I2510 Atherosclerotic heart disease of native coronary artery without angina pectoris: Secondary | ICD-10-CM | POA: Diagnosis not present

## 2014-12-27 DIAGNOSIS — E1151 Type 2 diabetes mellitus with diabetic peripheral angiopathy without gangrene: Secondary | ICD-10-CM | POA: Diagnosis not present

## 2014-12-27 DIAGNOSIS — I10 Essential (primary) hypertension: Secondary | ICD-10-CM | POA: Diagnosis not present

## 2014-12-27 DIAGNOSIS — K922 Gastrointestinal hemorrhage, unspecified: Secondary | ICD-10-CM | POA: Diagnosis not present

## 2014-12-27 DIAGNOSIS — K209 Esophagitis, unspecified: Secondary | ICD-10-CM | POA: Diagnosis not present

## 2014-12-27 DIAGNOSIS — E440 Moderate protein-calorie malnutrition: Secondary | ICD-10-CM | POA: Diagnosis not present

## 2014-12-27 DIAGNOSIS — I2510 Atherosclerotic heart disease of native coronary artery without angina pectoris: Secondary | ICD-10-CM | POA: Diagnosis not present

## 2014-12-30 DIAGNOSIS — E440 Moderate protein-calorie malnutrition: Secondary | ICD-10-CM | POA: Diagnosis not present

## 2014-12-30 DIAGNOSIS — I10 Essential (primary) hypertension: Secondary | ICD-10-CM | POA: Diagnosis not present

## 2014-12-30 DIAGNOSIS — K209 Esophagitis, unspecified: Secondary | ICD-10-CM | POA: Diagnosis not present

## 2014-12-30 DIAGNOSIS — K922 Gastrointestinal hemorrhage, unspecified: Secondary | ICD-10-CM | POA: Diagnosis not present

## 2014-12-30 DIAGNOSIS — E1151 Type 2 diabetes mellitus with diabetic peripheral angiopathy without gangrene: Secondary | ICD-10-CM | POA: Diagnosis not present

## 2014-12-30 DIAGNOSIS — I2510 Atherosclerotic heart disease of native coronary artery without angina pectoris: Secondary | ICD-10-CM | POA: Diagnosis not present

## 2015-01-03 DIAGNOSIS — K922 Gastrointestinal hemorrhage, unspecified: Secondary | ICD-10-CM | POA: Diagnosis not present

## 2015-01-03 DIAGNOSIS — I2510 Atherosclerotic heart disease of native coronary artery without angina pectoris: Secondary | ICD-10-CM | POA: Diagnosis not present

## 2015-01-03 DIAGNOSIS — I10 Essential (primary) hypertension: Secondary | ICD-10-CM | POA: Diagnosis not present

## 2015-01-03 DIAGNOSIS — K209 Esophagitis, unspecified: Secondary | ICD-10-CM | POA: Diagnosis not present

## 2015-01-03 DIAGNOSIS — E1151 Type 2 diabetes mellitus with diabetic peripheral angiopathy without gangrene: Secondary | ICD-10-CM | POA: Diagnosis not present

## 2015-01-03 DIAGNOSIS — E440 Moderate protein-calorie malnutrition: Secondary | ICD-10-CM | POA: Diagnosis not present

## 2015-02-08 ENCOUNTER — Other Ambulatory Visit: Payer: Self-pay | Admitting: Family Medicine

## 2015-04-03 ENCOUNTER — Other Ambulatory Visit: Payer: Self-pay | Admitting: Family Medicine

## 2015-04-03 ENCOUNTER — Other Ambulatory Visit: Payer: Self-pay

## 2015-04-10 ENCOUNTER — Other Ambulatory Visit: Payer: Self-pay

## 2015-05-04 ENCOUNTER — Other Ambulatory Visit: Payer: Self-pay | Admitting: Family Medicine

## 2015-07-14 ENCOUNTER — Other Ambulatory Visit: Payer: Self-pay | Admitting: Family Medicine

## 2015-07-17 DIAGNOSIS — I25810 Atherosclerosis of coronary artery bypass graft(s) without angina pectoris: Secondary | ICD-10-CM | POA: Diagnosis not present

## 2015-07-17 DIAGNOSIS — I10 Essential (primary) hypertension: Secondary | ICD-10-CM | POA: Diagnosis not present

## 2015-07-17 DIAGNOSIS — I5022 Chronic systolic (congestive) heart failure: Secondary | ICD-10-CM | POA: Diagnosis not present

## 2015-07-17 DIAGNOSIS — I739 Peripheral vascular disease, unspecified: Secondary | ICD-10-CM | POA: Diagnosis not present

## 2015-10-09 ENCOUNTER — Other Ambulatory Visit: Payer: Self-pay

## 2016-03-05 IMAGING — CT NM PET TUM IMG RESTAG (PS) SKULL BASE T - THIGH
1 of 9 series · 1 of 25 positions shown · non-contrast
Comparison: PET-CT 03/26/2014

CLINICAL DATA: Subsequent treatment strategy for head neck
carcinoma. Squamous cell carcinoma..

EXAM:
NUCLEAR MEDICINE PET SKULL BASE TO THIGH
TECHNIQUE: 11.3 mCi F-18 FDG was injected intravenously. Full-ring PET imaging
was performed from the skull base to thigh after the radiotracer. CT
data was obtained and used for attenuation correction and anatomic
localization.
FASTING BLOOD GLUCOSE:  Value: Not provided mg/dl

[Series 4: pet wb (ac) · axial · 5.0mm · 4.07mm/px · 1 of 368 slices shown]
[im 245/368]
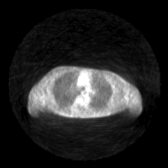

[1 of 25 positions shown; findings below may reference images not displayed]

FINDINGS: NECK

Resolution of the hypermetabolic activity at the base the tongue.
Linear activity in the longus [place] muscles is physiologic.

Mild asymmetric activity in the region of the right arytenoid
cartilage is felt to be physiologic (image 91).

Interval resolution of the right level 3 hypermetabolic adenopathy.
No hypermetabolic or enlarged lymph nodes on the left or right.

Surgical clips in the right neck consistent with right neck
dissection.

CHEST

No hypermetabolic mediastinal or hilar nodes. No suspicious
pulmonary nodules on the CT scan. A small left pleural effusion is
decreased in volume compared to prior. Nodule in the inferior
lingula measuring 9 mm on image 156, series 3 is felt to represent
benign pleural thickening. No metabolic activity.

ABDOMEN/PELVIS

No abnormal hypermetabolic activity within the liver, pancreas,
adrenal glands, or spleen. No hypermetabolic lymph nodes in the
abdomen or pelvis.

SKELETON

No focal hypermetabolic activity to suggest skeletal metastasis.
IMPRESSION: 1. No metabolically active tissue at the base of tongue.
2. No hypermetabolic cervical lymph nodes.  Right neck dissection
3. No evidence of distant metastasis.
4. Decreased left pleural effusion.

## 2016-03-18 ENCOUNTER — Other Ambulatory Visit: Payer: Self-pay

## 2016-03-22 ENCOUNTER — Other Ambulatory Visit: Payer: Self-pay

## 2016-04-08 DEATH — deceased
